# Patient Record
Sex: Male | Born: 1940
Health system: Southern US, Community
[De-identification: ages and names within clinical notes are randomized; demographics above are authoritative.]

## PROBLEM LIST (undated history)

## (undated) DIAGNOSIS — R55 Syncope and collapse: Secondary | ICD-10-CM

## (undated) DIAGNOSIS — R0600 Dyspnea, unspecified: Secondary | ICD-10-CM

## (undated) DIAGNOSIS — R000 Tachycardia, unspecified: Secondary | ICD-10-CM

## (undated) DIAGNOSIS — C439 Malignant melanoma of skin, unspecified: Secondary | ICD-10-CM

## (undated) DIAGNOSIS — E119 Type 2 diabetes mellitus without complications: Secondary | ICD-10-CM

## (undated) DIAGNOSIS — R202 Paresthesia of skin: Secondary | ICD-10-CM

## (undated) DIAGNOSIS — I10 Essential (primary) hypertension: Secondary | ICD-10-CM

## (undated) DIAGNOSIS — R200 Anesthesia of skin: Secondary | ICD-10-CM

## (undated) DIAGNOSIS — R296 Repeated falls: Secondary | ICD-10-CM

## (undated) DIAGNOSIS — R252 Cramp and spasm: Secondary | ICD-10-CM

## (undated) DIAGNOSIS — I499 Cardiac arrhythmia, unspecified: Secondary | ICD-10-CM

## (undated) DIAGNOSIS — M1990 Unspecified osteoarthritis, unspecified site: Secondary | ICD-10-CM

## (undated) DIAGNOSIS — Z86010 Personal history of colonic polyps: Secondary | ICD-10-CM

## (undated) DIAGNOSIS — N200 Calculus of kidney: Secondary | ICD-10-CM

## (undated) DIAGNOSIS — Z8701 Personal history of pneumonia (recurrent): Secondary | ICD-10-CM

## (undated) DIAGNOSIS — K649 Unspecified hemorrhoids: Secondary | ICD-10-CM

## (undated) DIAGNOSIS — G4733 Obstructive sleep apnea (adult) (pediatric): Secondary | ICD-10-CM

## (undated) DIAGNOSIS — K279 Peptic ulcer, site unspecified, unspecified as acute or chronic, without hemorrhage or perforation: Secondary | ICD-10-CM

## (undated) DIAGNOSIS — F329 Major depressive disorder, single episode, unspecified: Secondary | ICD-10-CM

## (undated) DIAGNOSIS — F32A Depression, unspecified: Secondary | ICD-10-CM

## (undated) HISTORY — DX: Depression, unspecified: F32.A

## (undated) HISTORY — DX: Obstructive sleep apnea (adult) (pediatric): G47.33

## (undated) HISTORY — DX: Major depressive disorder, single episode, unspecified: F32.9

## (undated) HISTORY — PX: ESOPHAGOGASTRODUODENOSCOPY: SHX1529

## (undated) HISTORY — PX: CERVICAL FUSION: SHX112

## (undated) HISTORY — DX: Tachycardia, unspecified: R00.0

## (undated) HISTORY — DX: Unspecified hemorrhoids: K64.9

## (undated) HISTORY — DX: Unspecified osteoarthritis, unspecified site: M19.90

## (undated) HISTORY — DX: Syncope and collapse: R55

## (undated) HISTORY — DX: Repeated falls: R29.6

## (undated) HISTORY — DX: Cramp and spasm: R25.2

## (undated) HISTORY — DX: Personal history of colonic polyps: Z86.010

## (undated) HISTORY — PX: ROTATOR CUFF REPAIR: SHX139

## (undated) HISTORY — PX: COLONOSCOPY: SHX174

## (undated) HISTORY — DX: Calculus of kidney: N20.0

## (undated) HISTORY — DX: Personal history of pneumonia (recurrent): Z87.01

## (undated) HISTORY — DX: Malignant melanoma of skin, unspecified: C43.9

## (undated) HISTORY — DX: Paresthesia of skin: R20.2

## (undated) HISTORY — DX: Paresthesia of skin: R20.0

## (undated) HISTORY — DX: Peptic ulcer, site unspecified, unspecified as acute or chronic, without hemorrhage or perforation: K27.9

## (undated) HISTORY — PX: MOHS SURGERY: SUR867

---
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
## 2002-10-31 DIAGNOSIS — Z860100 Personal history of colon polyps, unspecified: Secondary | ICD-10-CM

## 2002-10-31 HISTORY — DX: Personal history of colonic polyps: Z86.010

## 2002-10-31 HISTORY — DX: Personal history of colon polyps, unspecified: Z86.0100

## 2003-02-01 ENCOUNTER — Encounter: Admission: RE | Admit: 2003-02-01 | Discharge: 2003-02-01 | Payer: Self-pay | Admitting: *Deleted

## 2004-01-03 ENCOUNTER — Ambulatory Visit (HOSPITAL_BASED_OUTPATIENT_CLINIC_OR_DEPARTMENT_OTHER): Admission: RE | Admit: 2004-01-03 | Discharge: 2004-01-03 | Payer: Self-pay | Admitting: Internal Medicine

## 2004-04-24 ENCOUNTER — Ambulatory Visit: Payer: Self-pay | Admitting: Internal Medicine

## 2005-11-02 ENCOUNTER — Encounter: Admission: RE | Admit: 2005-11-02 | Discharge: 2005-11-02 | Payer: Self-pay | Admitting: Otolaryngology

## 2006-02-21 ENCOUNTER — Encounter: Admission: RE | Admit: 2006-02-21 | Discharge: 2006-02-21 | Payer: Self-pay | Admitting: Neurology

## 2006-05-17 ENCOUNTER — Ambulatory Visit: Payer: Self-pay | Admitting: Internal Medicine

## 2007-05-24 DIAGNOSIS — J309 Allergic rhinitis, unspecified: Secondary | ICD-10-CM

## 2007-05-24 DIAGNOSIS — G4733 Obstructive sleep apnea (adult) (pediatric): Secondary | ICD-10-CM | POA: Insufficient documentation

## 2007-05-24 HISTORY — DX: Allergic rhinitis, unspecified: J30.9

## 2007-05-25 ENCOUNTER — Ambulatory Visit: Payer: Self-pay | Admitting: Internal Medicine

## 2007-07-11 ENCOUNTER — Encounter: Payer: Self-pay | Admitting: Internal Medicine

## 2007-12-04 ENCOUNTER — Ambulatory Visit: Payer: Self-pay | Admitting: Gastroenterology

## 2007-12-18 ENCOUNTER — Ambulatory Visit: Payer: Self-pay | Admitting: Gastroenterology

## 2008-02-09 ENCOUNTER — Ambulatory Visit: Payer: Self-pay | Admitting: Internal Medicine

## 2008-03-03 ENCOUNTER — Encounter: Payer: Self-pay | Admitting: Internal Medicine

## 2008-07-29 ENCOUNTER — Encounter: Payer: Self-pay | Admitting: Internal Medicine

## 2008-10-15 ENCOUNTER — Encounter: Payer: Self-pay | Admitting: Internal Medicine

## 2008-10-22 ENCOUNTER — Encounter: Payer: Self-pay | Admitting: Internal Medicine

## 2009-02-07 ENCOUNTER — Ambulatory Visit: Payer: Self-pay | Admitting: Internal Medicine

## 2009-05-08 ENCOUNTER — Telehealth (INDEPENDENT_AMBULATORY_CARE_PROVIDER_SITE_OTHER): Payer: Self-pay | Admitting: *Deleted

## 2010-05-05 NOTE — Progress Notes (Signed)
Summary: prescription  Phone Note Call from Patient Call back at Work Phone [phone]   Caller: Patient Call For: young Summary of Call: Pt wants a rx for nasonex.//[facility]-[phone] Initial call taken by: Darletta Moll,  May 08, 2009 9:32 AM  Follow-up for Phone Call        Pt informed that rx reill was sent to [facility]. Abigail Miyamoto RN  May 08, 2009 10:18 AM     Prescriptions: NASONEX 50 MCG/ACT SUSP (MOMETASONE FUROATE) 1-2 sprays each nostril once daily  #1 x prn   Entered by:   Abigail Miyamoto RN   Authorized by:   Waymon Budge MD   Signed by:   Abigail Miyamoto RN on 05/08/2009   Method used:   Electronically to        [facility] [phone]* (retail)       [address]       [place], [place]  [address]       Ph: [phone]       Fax: [phone]   RxID:   [id number]

## 2010-08-21 NOTE — Procedures (Signed)
NAME:  JCION, BUDDENHAGEN                ACCOUNT NO.:  [id number]   MEDICAL RECORD NO.:  [id number]          PATIENT TYPE:  OUT   LOCATION:  SLEEP CENTER                 FACILITY[facility]  [facility]   PHYSICIAN:  Clinton D. Maple Hudson, M.D. DATE OF BIRTH:  1940-07-09   DATE OF STUDY:  01/03/2004  DATE OF DISCHARGE:  01/03/2004                              NOCTURNAL POLYSOMNOGRAM   REFERRING PHYSICIAN:  Dr. Jetty Duhamel   INDICATION FOR STUDY:  Hypersomnia with sleep apnea.  Epworth sleepiness  score 7/24.  BMI 32.  Weight 230 pounds.   SLEEP ARCHITECTURE:  Total sleep time 327 minutes with sleep efficiency 77%.  Stage I was 10%, stage II 81%, stages III and IV were absent.  REM was 9% of  total sleep time.  Sleep latency was 41 minutes.  REM latency was 268  minutes.  Awake after sleep onset 48 minutes.  Arousal index 17.  No sleep  medications were taken.  He had brought Sonata, but chose not to take it.   RESPIRATORY DATA:  RDI 30.8/hour reflecting 56 obstructive apneas and 112  hypopneas.  Events were not positional.  REM RDI 71.  Technician could not  use split protocol because events developed too late in the night to allow  time for titration.   OXYGEN DATA:  Mild to moderate snoring with desaturation to a nadir of 79%  with apneas.  Mean saturation through the study was 92-93% on room air.   CARDIAC DATA:  Sinus bradycardia 48-54 per minute.   MOVEMENT/PARASOMNIA:  Occasional leg jerk, insignificant.   IMPRESSION/RECOMMENDATION:  Moderate obstructive sleep apnea/hypopnea  syndrome, RDI 30.8/hour with desaturation to 79%.  Sinus bradycardia.  Consider return for CPAP titration or evaluation for alternative therapies.      CDY/MEDQ  D:  01/12/2004 10:36:44  T:  01/13/2004 10:46:00  Job:  [id number]

## 2010-08-21 NOTE — Assessment & Plan Note (Signed)
[facility]                             PULMONARY OFFICE NOTE   NAME:[name], Ricardo LITLE                       MRN:          [id number]  DATE:05/17/2006                            DOB:          Feb 06, 1941    PROBLEM:  1. Obstructive sleep apnea.  2. Perennial rhinitis.   HISTORY:  One year followup.  He continues to feel life is better with  CPAP.  He just did an oximetry download on January 31, showing excellent  maintenance of oxygen saturation over 90% through the night on CPAP.  Pressure is set at 9.  He is told that sometimes he appears to stop  breathing and sometimes he is venting through his mouth.  We discussed  options and settled on a decision that we would try setting the pressure  1 step higher to see what he noticed.  He is working now with [place].   MEDICATIONS:  1. Aspirin 81 mg.  2. Wellbutrin 150 mg.  3. CPAP has been at 9 CWP.   No medication allergy.   OBJECTIVE:  Weight 236 pounds.  BP 132/82, pulse regular 72.  Room air  saturation 96%.  He is alert, mildly overweight.  There are no pressure marks around his  face.  Nasal airway is unobstructed, breathing is unlabored.  Pulse regular.   IMPRESSION:  Obstructive sleep apnea probably with good control.  Given  his family observations we have decided to ask [employer] to try moving the  pressure up 1 step to 10 CWP for trial.  He will call if this is  uncomfortable and I will schedule to return in 1 year, earlier as  needed.     Clinton D. Maple Hudson, MD, Tonny Bollman, FACP  Electronically Signed    CDY/MedQ  DD: 05/17/2006  DT: 05/18/2006  Job #: [id number]

## 2011-02-08 ENCOUNTER — Telehealth: Payer: Self-pay | Admitting: Internal Medicine

## 2011-02-08 NOTE — Telephone Encounter (Signed)
Pt last seen 02-2009 so I advised the pt that we are unable to send in refill unless he sets an appt. Pt states he will have to think about it and call us back. Carron Curie, CMA

## 2011-08-17 ENCOUNTER — Encounter: Payer: Self-pay | Admitting: *Deleted

## 2011-09-16 ENCOUNTER — Other Ambulatory Visit: Payer: Self-pay | Admitting: Internal Medicine

## 2011-09-16 DIAGNOSIS — I10 Essential (primary) hypertension: Secondary | ICD-10-CM

## 2011-09-17 ENCOUNTER — Other Ambulatory Visit: Payer: Self-pay

## 2011-09-20 ENCOUNTER — Ambulatory Visit
Admission: RE | Admit: 2011-09-20 | Discharge: 2011-09-20 | Disposition: A | Discharge status: Home / Self Care | Payer: Medicare Other | Source: Ambulatory Visit | Attending: Internal Medicine | Admitting: Internal Medicine

## 2011-09-20 DIAGNOSIS — I10 Essential (primary) hypertension: Secondary | ICD-10-CM

## 2011-12-17 ENCOUNTER — Encounter: Payer: Self-pay | Admitting: Cardiovascular Disease

## 2011-12-21 ENCOUNTER — Encounter: Payer: Self-pay | Admitting: Cardiovascular Disease

## 2012-02-17 ENCOUNTER — Encounter: Payer: Self-pay | Admitting: Gastroenterology

## 2012-03-01 ENCOUNTER — Encounter: Payer: Self-pay | Admitting: *Deleted

## 2012-03-10 ENCOUNTER — Encounter: Payer: Self-pay | Admitting: Gastroenterology

## 2012-03-10 ENCOUNTER — Ambulatory Visit (INDEPENDENT_AMBULATORY_CARE_PROVIDER_SITE_OTHER): Payer: Medicare Other | Admitting: Gastroenterology

## 2012-03-10 VITALS — BP 110/70 | HR 85 | Ht 69.5 in | Wt 218.4 lb

## 2012-03-10 DIAGNOSIS — Z86010 Personal history of colonic polyps: Secondary | ICD-10-CM

## 2012-03-10 DIAGNOSIS — K649 Unspecified hemorrhoids: Secondary | ICD-10-CM

## 2012-03-10 DIAGNOSIS — K625 Hemorrhage of anus and rectum: Secondary | ICD-10-CM

## 2012-03-10 MED ORDER — MOVIPREP 100 G PO SOLR: 1.0000 | Freq: Once | ORAL | Status: DC

## 2012-03-10 MED ORDER — LIDOCAINE-HYDROCORTISONE ACE 3-0.5 % RE CREA: 1.0000 | TOPICAL_CREAM | Freq: Every day | RECTAL | Status: DC

## 2012-03-10 NOTE — Progress Notes (Signed)
History of Present Illness:  This is a 71 year old Caucasian male several weeks of periodic bright red blood per rectum.  He denies other gastrointestinal symptoms.  I did screening colonoscopy on him in 2004 and September 2009.  These exams were unremarkable except for some hyperplastic polyps.  He recently saw his primary care physician and his CBC was normal.  He was placed on Anusol-HC suppositories, but he has not been able to hold these in his rectum.  His family history is noncontributory.  He otherwise is in good health without serious medical problems.  Family history is noncontributory.  He does have a history of benign PVCs, sleep apnea, and chronic depression.  View of his record shows a normal echocardiogram in March of 2011 with normal left ventricular function, and mild aortic stenosis.  Also cares a diagnosis of polycythemia and atypical chest pain.  I have reviewed this patient's present history, medical and surgical past history, allergies and medications.     ROS: The remainder of the 10 point ROS is negative     Physical Exam: Blood pressure 110/70, pulse 85 and regular, and weight 218 pounds the BMI of 31.79.  Oxygen saturation 98%. General well developed well nourished patient in no acute distress, appearing their stated age Eyes PERRLA, no icterus, fundoscopic exam per opthamologist Skin no lesions noted Neck supple, no adenopathy, no thyroid enlargement, no tenderness Chest clear to percussion and auscultation Heart no significant murmurs, gallops or rubs noted Abdomen no hepatosplenomegaly masses or tenderness, BS normal.  Rectal inspection normal no fissures, or fistulae noted.  No masses or tenderness on digital exam. Stool guaiac negative.  There is a large posterior lateral external hemorrhoid.  There was a clot in his hemorrhoid  which was manually extruded today. Extremities no acute joint lesions, edema, phlebitis or evidence of cellulitis. Neurologic patient  oriented x 3, cranial nerves intact, no focal neurologic deficits noted. Psychological mental status normal and normal affect.  Assessment and plan: Hematochezia from external hemorrhoids in a 71 year old patient with a history of colon polyps.  I have set him up for colonoscopy exam at his convenience.  His bleeding obviously is from his hemorrhoids, and I have asked him to do each bedtime Sitz baths with local Analpram cream as tolerated.  Also recommend a high fiber diet with daily Metamucil and liberal by mouth fluids.  His continue his other medications as per primary care.  Encounter Diagnoses  Name Primary?  . Hemorrhoids Yes  . Hx of colonic polyps

## 2012-03-10 NOTE — Patient Instructions (Signed)
You have been scheduled for a colonoscopy with propofol. Please follow written instructions given to you at your visit today.  Please pick up your prep kit at the pharmacy within the next 1-3 days. If you use inhalers (even only as needed) or a CPAP machine, please bring them with you on the day of your procedure.  We have sent the following medications to your pharmacy for you to pick up at your convenience: Anamantle cream. Please apply at bedtime.  Please purchase Metamucil over the counter. Take as directed.  Please follow high fiber diet given today.  Information on hemorrhoids and sitz bath given today.  CC: Jarome Matin, MD   _______________________________________________________________________________________________________________  High-Fiber Diet Fiber is found in fruits, vegetables, and grains. A high-fiber diet encourages the addition of more whole grains, legumes, fruits, and vegetables in your diet. The recommended amount of fiber for adult males is 38 g per day. For adult females, it is 25 g per day. Pregnant and lactating women should get 28 g of fiber per day. If you have a digestive or bowel problem, ask your caregiver for advice before adding high-fiber foods to your diet. Eat a variety of high-fiber foods instead of only a select few type of foods.  PURPOSE  To increase stool bulk.  To make bowel movements more regular to prevent constipation.  To lower cholesterol.  To prevent overeating. WHEN IS THIS DIET USED?  It may be used if you have constipation and hemorrhoids.  It may be used if you have uncomplicated diverticulosis (intestine condition) and irritable bowel syndrome.  It may be used if you need help with weight management.  It may be used if you want to add it to your diet as a protective measure against atherosclerosis, diabetes, and cancer. SOURCES OF FIBER  Whole-grain breads and cereals.  Fruits, such as apples, oranges, bananas,  berries, prunes, and pears.  Vegetables, such as green peas, carrots, sweet potatoes, beets, broccoli, cabbage, spinach, and artichokes.  Legumes, such split peas, soy, lentils.  Almonds. FIBER CONTENT IN FOODS Starches and Grains / Dietary Fiber (g)  Cheerios, 1 cup / 3 g  Corn Flakes cereal, 1 cup / 0.7 g  Rice crispy treat cereal, 1 cup / 0.3 g  Instant oatmeal (cooked),  cup / 2 g  Frosted wheat cereal, 1 cup / 5.1 g  Brown, long-grain rice (cooked), 1 cup / 3.5 g  White, long-grain rice (cooked), 1 cup / 0.6 g  Enriched macaroni (cooked), 1 cup / 2.5 g Legumes / Dietary Fiber (g)  Baked beans (canned, plain, or vegetarian),  cup / 5.2 g  Kidney beans (canned),  cup / 6.8 g  Pinto beans (cooked),  cup / 5.5 g Breads and Crackers / Dietary Fiber (g)  Plain or honey graham crackers, 2 squares / 0.7 g  Saltine crackers, 3 squares / 0.3 g  Plain, salted pretzels, 10 pieces / 1.8 g  Whole-wheat bread, 1 slice / 1.9 g  White bread, 1 slice / 0.7 g  Raisin bread, 1 slice / 1.2 g  Plain bagel, 3 oz / 2 g  Flour tortilla, 1 oz / 0.9 g  Corn tortilla, 1 small / 1.5 g  Hamburger or hotdog bun, 1 small / 0.9 g Fruits / Dietary Fiber (g)  Apple with skin, 1 medium / 4.4 g  Sweetened applesauce,  cup / 1.5 g  Banana,  medium / 1.5 g  Grapes, 10 grapes / 0.4 g  Orange,  1 small / 2.3 g  Raisin, 1.5 oz / 1.6 g  Melon, 1 cup / 1.4 g Vegetables / Dietary Fiber (g)  Green beans (canned),  cup / 1.3 g  Carrots (cooked),  cup / 2.3 g  Broccoli (cooked),  cup / 2.8 g  Peas (cooked),  cup / 4.4 g  Mashed potatoes,  cup / 1.6 g  Lettuce, 1 cup / 0.5 g  Corn (canned),  cup / 1.6 g  Tomato,  cup / 1.1 g Document Released: 03/22/2005 Document Revised: 09/21/2011 Document Reviewed: 06/24/2011 Exit[employer] Patient Information 2013 [employer].  _________________________________________________________________________________________________________________  Hemorrhoids Hemorrhoids are enlarged (dilated) veins around the rectum. There are 2 types of hemorrhoids, and the type of hemorrhoid is determined by its location. Internal hemorrhoids occur in the veins just inside the rectum.They are usually not painful, but they may bleed.However, they may poke through to the outside and become irritated and painful. External hemorrhoids involve the veins outside the anus and can be felt as a painful swelling or hard lump near the anus.They are often itchy and may crack and bleed. Sometimes clots will form in the veins. This makes them swollen and painful. These are called thrombosed hemorrhoids. CAUSES Causes of hemorrhoids include:  Pregnancy. This increases the pressure in the hemorrhoidal veins.  Constipation.  Straining to have a bowel movement.  Obesity.  Heavy lifting or other activity that caused you to strain. TREATMENT Most of the time hemorrhoids improve in 1 to 2 weeks. However, if symptoms do not seem to be getting better or if you have a lot of rectal bleeding, your caregiver may perform a procedure to help make the hemorrhoids get smaller or remove them completely.Possible treatments include:  Rubber band ligation. A rubber band is placed at the base of the hemorrhoid to cut off the circulation.  Sclerotherapy. A chemical is injected to shrink the hemorrhoid.  Infrared light therapy. Tools are used to burn the hemorrhoid.  Hemorrhoidectomy. This is surgical removal of the hemorrhoid. HOME CARE INSTRUCTIONS   Increase fiber in your diet. Ask your caregiver about using fiber supplements.  Drink enough water and fluids to keep your urine clear or pale yellow.  Exercise regularly.  Go to the bathroom when you have the urge to have a bowel movement. Do not wait.  Avoid straining to have bowel  movements.  Keep the anal area dry and clean.  Only take over-the-counter or prescription medicines for pain, discomfort, or fever as directed by your caregiver. If your hemorrhoids are thrombosed:  Take warm sitz baths for 20 to 30 minutes, 3 to 4 times per day.  If the hemorrhoids are very tender and swollen, place ice packs on the area as tolerated. Using ice packs between sitz baths may be helpful. Fill a plastic bag with ice. Place a towel between the bag of ice and your skin.  Medicated creams and suppositories may be used or applied as directed.  Do not use a donut-shaped pillow or sit on the toilet for long periods. This increases blood pooling and pain. SEEK MEDICAL CARE IF:   You have increasing pain and swelling that is not controlled with your medicine.  You have uncontrolled bleeding.  You have difficulty or you are unable to have a bowel movement.  You have pain or inflammation outside the area of the hemorrhoids.  You have chills or an oral temperature above 102 F (38.9 C). MAKE SURE YOU:   Understand these instructions.  Will  watch your condition.  Will get help right away if you are not doing well or get worse. Document Released: 03/19/2000 Document Revised: 06/14/2011 Document Reviewed: 03/02/2010 [facility] Patient Information 2013 [place], [place].

## 2012-03-20 ENCOUNTER — Ambulatory Visit (AMBULATORY_SURGERY_CENTER): Payer: Medicare Other | Admitting: Gastroenterology

## 2012-03-20 ENCOUNTER — Encounter: Payer: Self-pay | Admitting: Gastroenterology

## 2012-03-20 VITALS — BP 142/97 | HR 66 | Temp 97.1°F | Resp 17 | Ht 69.0 in | Wt 218.0 lb

## 2012-03-20 DIAGNOSIS — D126 Benign neoplasm of colon, unspecified: Secondary | ICD-10-CM

## 2012-03-20 DIAGNOSIS — Z86010 Personal history of colonic polyps: Secondary | ICD-10-CM

## 2012-03-20 DIAGNOSIS — K649 Unspecified hemorrhoids: Secondary | ICD-10-CM

## 2012-03-20 MED ORDER — SODIUM CHLORIDE 0.9 % IV SOLN: 500.0000 mL | INTRAVENOUS | Status: DC

## 2012-03-20 NOTE — Progress Notes (Signed)
Called to room to assist during endoscopic procedure.  Patient ID and intended procedure confirmed with present staff. Received instructions for my participation in the procedure from the performing physician. ewm 

## 2012-03-20 NOTE — Progress Notes (Signed)
Pt. Felt that he needed his device for sleep apnea-not a c-pap machine.  Spoke with [facility] CRNA about pt. Concerns. She stated that the device would not be necessary-pt. Advised.

## 2012-03-20 NOTE — Progress Notes (Addendum)
Patient did not have preoperative order for IV antibiotic SSI prophylaxis. (G8918)  Patient did not experience any of the following events: a burn prior to discharge; a fall within the facility; wrong site/side/patient/procedure/implant event; or a hospital transfer or hospital admission upon discharge from the facility. (G8907)  

## 2012-03-20 NOTE — Patient Instructions (Addendum)

## 2012-03-20 NOTE — Op Note (Signed)
[facility] [address] [place], [address]   COLONOSCOPY PROCEDURE REPORT  PATIENT: Ricardo [name], Ricardo [name]  MR#: [id number] BIRTHDATE: 27-Apr-1940 , 71  yrs. old GENDER: Male ENDOSCOPIST: Mardella Layman, MD, [facility] REFERRED BY:  Jarome Matin, M.D. PROCEDURE DATE:  03/20/2012 PROCEDURE:   Colonoscopy with snare polypectomy ASA CLASS:   Class II INDICATIONS:Rectal Bleeding and Average risk patient for colon cancer. MEDICATIONS: propofol (Diprivan) 300mg  IV  DESCRIPTION OF PROCEDURE:   After the risks and benefits and of the procedure were explained, informed consent was obtained.  A digital rectal exam revealed no abnormalities of the rectum.    The LB [id number]  endoscope was introduced through the anus and advanced to the cecum, which was identified by both the appendix and ileocecal valve .  The quality of the prep was excellent, using MoviPrep .  The instrument was then slowly withdrawn as the colon was fully examined.     COLON FINDINGS: Moderate diverticulosis was noted in the descending colon and sigmoid colon.   Two smooth flat polyps ranging between 3-34mm in size were found in the rectum.  A polypectomy was performed using snare cautery.  The resection was complete and the polyp tissue was completely retrieved.     Retroflexed views revealed no abnormalities.     The scope was then withdrawn from the patient and the procedure completed.  COMPLICATIONS: There were no complications. ENDOSCOPIC IMPRESSION: 1.   Moderate diverticulosis was noted in the descending colon and sigmoid colon 2.   Two flat polyps ranging between 3-2mm in size were found in the rectum; polypectomy was performed using snare cautery ...these appear to be in a very vascular polyps, and appear to be the site of the rectal bleeding.  They were removed by electrocautery techniques and cauterized.  RECOMMENDATIONS: 1.  Avoid all NSAIDS for the next 2 weeks. 2.  Await  pathology results 3.  Repeat colonoscopy in 5 years if polyp adenomatous; otherwise 10 years 4.  High fiber diet   REPEAT EXAM:  cc:  _______________________________ eSignedMardella Layman, MD, [facility] 03/20/2012 2:07 PM     PATIENT NAME:  Ricardo [name], Ricardo [name] MR#: [id number]

## 2012-03-21 ENCOUNTER — Telehealth: Payer: Self-pay | Admitting: *Deleted

## 2012-03-21 NOTE — Telephone Encounter (Signed)
  Follow up Call-  Call back number 03/20/2012  Post procedure Call Back phone  # [phone]  close to 8:30  Permission to leave phone message Yes     Patient questions:  Do you have a fever, pain , or abdominal swelling? no Pain Score  0 *  Have you tolerated food without any problems? yes  Have you been able to return to your normal activities? yes  Do you have any questions about your discharge instructions: Diet   no Medications  no Follow up visit  no  Do you have questions or concerns about your Care? no  Actions: * If pain score is 4 or above: No action needed, pain <4.

## 2012-04-04 ENCOUNTER — Encounter: Payer: Self-pay | Admitting: Gastroenterology

## 2012-10-16 ENCOUNTER — Other Ambulatory Visit: Payer: Self-pay | Admitting: Dermatology

## 2015-03-05 ENCOUNTER — Ambulatory Visit (INDEPENDENT_AMBULATORY_CARE_PROVIDER_SITE_OTHER): Payer: PPO | Admitting: Emergency Medicine

## 2015-03-05 ENCOUNTER — Encounter: Payer: Self-pay | Admitting: Emergency Medicine

## 2015-03-05 VITALS — BP 154/94 | HR 76 | Ht 70.0 in | Wt 221.0 lb

## 2015-03-05 DIAGNOSIS — G4733 Obstructive sleep apnea (adult) (pediatric): Secondary | ICD-10-CM | POA: Diagnosis not present

## 2015-03-05 NOTE — Progress Notes (Signed)
   Subjective:    Patient ID: Ricardo [name], male    DOB: [birth year], 74 y.o.   MRN: [id number]  HPI 74 year old man, former smoker, with a history of known obstructive sleep apnea based on a remote polysomnogram back in .   He was on CPAP 2005-2010. He stopped it after he tried dental appliance - still uses it. It does help his OSA but it has altered his bite, causing him trouble. He is interested in in getting back on CPAP. He denies napping, falling asleep unintentionally. He is well rested as long as he uses his oral appliance   Review of Systems  Past Medical History  Diagnosis Date  . Syncope   . Tachycardia   . Chest pain   . Hx of colonic polyps 10/31/2002    Hyperplastic   . Hemorrhoids   . Depression   . Obstructive sleep apnea   . Peptic ulcer   . History of pneumonia      Family History  Problem Relation Age of Onset  . Stroke Father   . Hypertension Mother   . Pancreatic cancer Mother   . Anuerysm Brother     AAA  . Anuerysm Father     AAA     Social History   Social History  . Marital Status: Married    Spouse Name: N/A  . Number of Children: 3  . 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 Smoking status: Former Smoker    Types: Pipe    Quit date: 04/05/1988  . Smokeless tobacco: Never Used  . Alcohol Use: Yes     Comment: occasionally  . Drug Use: No  . Sexual Activity: Not on file   Other Topics Concern  . Not on file   Social History Narrative     No Known Allergies   Outpatient Prescriptions Prior to Visit  Medication Sig Dispense Refill  . Multiple Vitamin (MULTIVITAMIN) tablet Take 1 tablet by mouth daily.    Marland Kitchen aspirin 81 MG tablet Take 162 mg by mouth daily.    Marland Kitchen buPROPion (WELLBUTRIN XL) 150 MG 24 hr tablet Take 300 mg by mouth daily.    Marland Kitchen lidocaine-hydrocortisone (ANAMANTLE HC) 3-0.5 % CREA Place 1 Applicatorful rectally at bedtime. 7 g 2   No  facility-administered medications prior to visit.         Objective:   Physical Exam Filed Vitals:   03/05/15 1547  BP: 154/94  Pulse: 76  Height: 5\' 10"  (1.778 m)  Weight: 221 lb (100.245 kg)  SpO2: 96%   Gen: Pleasant, well-nourished, in no distress,  normal affect  ENT: No lesions,  mouth clear,  oropharynx clear, no postnasal drip  Neck: No JVD, no TMG, no carotid bruits  Lungs: No use of accessory muscles, no dullness to percussion, clear without rales or rhonchi  Cardiovascular: RRR, heart sounds normal, no murmur or gallops, no peripheral edema  Abdomen: soft and NT, no HSM,  BS normal  Musculoskeletal: No deformities, no cyanosis or clubbing  Neuro: alert, non focal  Skin: Warm, no lesions or rashes       Assessment & Plan:  SLEEP APNEA We will arrange for a home sleep study. Once we confirm that he has obstructive sleep apnea we will arrange for a home CPAP titration study and then order his CPAP. I will follow-up with him in 2 months

## 2015-03-05 NOTE — Patient Instructions (Signed)
We will order a home sleep study.  Once we confirm that you have sleep apnea we will arrange for a home CPAP titration study.   Continue to use your oral appliance for now  Follow with Dr Lamonte Sakai in 2 months or sooner if you have any problems.

## 2015-03-05 NOTE — Assessment & Plan Note (Signed)
We will arrange for a home sleep study. Once we confirm that he has obstructive sleep apnea we will arrange for a home CPAP titration study and then order his CPAP. I will follow-up with him in 2 months

## 2015-03-19 DIAGNOSIS — G4733 Obstructive sleep apnea (adult) (pediatric): Secondary | ICD-10-CM | POA: Diagnosis not present

## 2015-03-21 DIAGNOSIS — G4733 Obstructive sleep apnea (adult) (pediatric): Secondary | ICD-10-CM | POA: Diagnosis not present

## 2015-03-26 ENCOUNTER — Encounter: Payer: Self-pay | Admitting: Emergency Medicine

## 2015-04-03 ENCOUNTER — Telehealth: Payer: Self-pay | Admitting: Emergency Medicine

## 2015-04-03 DIAGNOSIS — G4733 Obstructive sleep apnea (adult) (pediatric): Secondary | ICD-10-CM

## 2015-04-03 NOTE — Telephone Encounter (Signed)
Pt returning call [phone]

## 2015-04-03 NOTE — Telephone Encounter (Signed)
atc X2, fast busy signal.  Wcb.  

## 2015-04-03 NOTE — Telephone Encounter (Signed)
LVM for patient to return call. 

## 2015-04-04 NOTE — Telephone Encounter (Signed)
Pt called and is requesting his Ricardo [name] sleep results from 12/14 (in epic). Please advise RB on results. thanks

## 2015-04-07 NOTE — Telephone Encounter (Signed)
Please let the patient know that his Home sleep study is consistent with moderate sleep apnea with contributions of both obstructive apnea and central apneas. If he is willing to do so then we should attempt to set up CPAP.  I believe this would best be accomplished with a CPAP titration study in sleep lab. Please order if he is willing

## 2015-04-08 NOTE — Telephone Encounter (Signed)
Spoke with pt. He is aware of his sleep study results. Order has been placed for CPAP titration study. Nothing further was needed. 

## 2015-04-08 NOTE — Telephone Encounter (Signed)
[phone] calling back

## 2015-04-08 NOTE — Telephone Encounter (Signed)
Attempted to call pt. No answer. Will try back. 

## 2015-04-21 ENCOUNTER — Ambulatory Visit (HOSPITAL_BASED_OUTPATIENT_CLINIC_OR_DEPARTMENT_OTHER): Payer: PPO | Attending: Emergency Medicine | Admitting: Radiology

## 2015-04-21 DIAGNOSIS — G4733 Obstructive sleep apnea (adult) (pediatric): Secondary | ICD-10-CM | POA: Diagnosis not present

## 2015-04-21 DIAGNOSIS — G4730 Sleep apnea, unspecified: Secondary | ICD-10-CM | POA: Diagnosis present

## 2015-04-21 DIAGNOSIS — I493 Ventricular premature depolarization: Secondary | ICD-10-CM | POA: Diagnosis not present

## 2015-04-21 DIAGNOSIS — G4761 Periodic limb movement disorder: Secondary | ICD-10-CM | POA: Insufficient documentation

## 2015-04-23 DIAGNOSIS — G4733 Obstructive sleep apnea (adult) (pediatric): Secondary | ICD-10-CM | POA: Diagnosis not present

## 2015-04-23 NOTE — Progress Notes (Signed)
Patient Name: Ricardo [name], Ricardo [name] Date: 04/21/2015 Gender: Male D.O.B: 1940-11-20 Age (years): 74 Referring Provider: Baltazar Apo Height (inches): 70 Interpreting Physician: Chesley Mires MD, ABSM Weight (lbs): 222 RPSGT: Carolin Coy BMI: 32 MRN: [id number] Neck Size: 16.00  CLINICAL INFORMATION The patient is referred for a CPAP titration to treat sleep apnea.   Date of NPSG, Split Night or HST: 03/19/15  SLEEP STUDY TECHNIQUE As per the AASM Manual for the Scoring of Sleep and Associated Events v2.3 (April 2016) with a hypopnea requiring 4% desaturations. The channels recorded and monitored were frontal, central and occipital EEG, electrooculogram (EOG), submentalis EMG (chin), nasal and oral airflow, thoracic and abdominal wall motion, anterior tibialis EMG, snore microphone, electrocardiogram, and pulse oximetry. Continuous positive airway pressure (CPAP) was initiated at the beginning of the study and titrated to treat sleep-disordered breathing.  MEDICATIONS Medications taken by the patient : reviewed in electronic medical record. Medications administered by patient during sleep study : No sleep medicine administered.  TECHNICIAN COMMENTS Comments added by technician: Patient was ordered as a cpap titration.  Comments added by scorer: N/A  RESPIRATORY PARAMETERS Optimal PAP Pressure (cm): 9 AHI at Optimal Pressure (/hr): 0.6 Overall Minimal O2 (%): 91.00 Supine % at Optimal Pressure (%): 100 Minimal O2 at Optimal Pressure (%): 91.0    SLEEP ARCHITECTURE The study was initiated at 11:02:04 PM and ended at 5:19:21 AM. Sleep onset time was 14.5 minutes and the sleep efficiency was 78.4%. The total sleep time was 295.8 minutes. The patient spent 7.77% of the night in stage N1 sleep, 82.76% in stage N2 sleep, 0.34% in stage N3 and 9.13% in REM.Stage REM latency was 224.0 minutes Wake after sleep onset was 67.0. Alpha intrusion was absent. Supine sleep was 96.45%.  CARDIAC  DATA The 2 lead EKG demonstrated sinus rhythm. The mean heart rate was 56.93 beats per minute. Other EKG findings include: PVCs.  LEG MOVEMENT DATA The total Periodic Limb Movements of Sleep (PLMS) were 313. The PLMS index was 63.48. A PLMS index of <15 is considered normal in adults.  IMPRESSIONS This was a successful CPAP titration.  He did well with CPAP 9 cm H2O.  He was observed in REM sleep at this pressure setting.  He had an increase in his periodic limb movement index.  DIAGNOSIS - Obstructive Sleep Apnea (327.23 [G47.33 ICD-10])  RECOMMENDATIONS Trial of CPAP 9 cm H2O.  He was fitted with a Standard size Resmed Nasal Mask Mirage FX mask.  Assess for the presence of restless leg syndrome.  Chesley Mires, MD, [place], American Board of Sleep Medicine 04/23/2015, 4:50 PM  NPI: [id number]

## 2015-05-09 ENCOUNTER — Encounter: Payer: Self-pay | Admitting: Emergency Medicine

## 2015-05-09 ENCOUNTER — Ambulatory Visit (INDEPENDENT_AMBULATORY_CARE_PROVIDER_SITE_OTHER): Payer: PPO | Admitting: Emergency Medicine

## 2015-05-09 VITALS — BP 154/90 | HR 76 | Ht 70.0 in | Wt 221.0 lb

## 2015-05-09 DIAGNOSIS — G4733 Obstructive sleep apnea (adult) (pediatric): Secondary | ICD-10-CM | POA: Diagnosis not present

## 2015-05-09 NOTE — Addendum Note (Signed)
Addended by: Virl Cagey on: 05/09/2015 03:37 PM   Modules accepted: Orders

## 2015-05-09 NOTE — Assessment & Plan Note (Signed)
The patient's home sleep study in split-night sleep study confirmed sleep apnea and confirm that he has resolution with CPAP at 9 cmH2O. He use a nasal mask with chinstrap we will order this for him for [facility]

## 2015-05-09 NOTE — Progress Notes (Signed)
Subjective:    Patient ID: Ricardo [name], male    DOB: December 17, 1940, 75 y.o.   MRN: [id number]  HPI 74 year old man, former smoker, with a history of known obstructive sleep apnea based on a remote polysomnogram back in .   He was on CPAP 2005-2010. He stopped it after he tried dental appliance - still uses it. It does help his OSA but it has altered his bite, causing him trouble. He is interested in in getting back on CPAP. He denies napping, falling asleep unintentionally. He is well rested as long as he uses his oral appliance  ROV 05/09/15 -- patient follows up for his obstructive sleep apnea. He underwent a home sleep study /16/17 that confirmed obstructive sleep apnea with titration to 9 cm of water.  Nasal mask with a chin strap.    Review of Systems  Past Medical History  Diagnosis Date  . Syncope   . Tachycardia   . Chest pain   . Hx of colonic polyps 10/31/2002    Hyperplastic   . Hemorrhoids   . Depression   . Obstructive sleep apnea   . Peptic ulcer   . History of pneumonia      Family History  Problem Relation Age of Onset  . Stroke Father   . Hypertension Mother   . Pancreatic cancer Mother   . Anuerysm Brother     AAA  . Anuerysm Father     AAA     Social History   Social History  . Marital Status: Married    Spouse Name: N/A  . Number of Children: 3  . 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 Smoking status: Former Smoker    Types: Pipe    Quit date: 04/05/1988  . Smokeless tobacco: Never Used  . Alcohol Use: Yes     Comment: occasionally  . Drug Use: No  . Sexual Activity: Not on file   Other Topics Concern  . Not on file   Social History Narrative     No Known Allergies   Outpatient Prescriptions Prior to Visit  Medication Sig Dispense Refill  . aspirin 81 MG tablet Take 81 mg by mouth daily.    . Multiple Vitamin (MULTIVITAMIN) tablet Take 1 tablet by mouth daily.      No facility-administered medications prior to visit.         Objective:   Physical Exam Filed Vitals:   05/09/15 1457 05/09/15 1459  BP: 162/86 154/90  Pulse: 76   Height: 5\' 10"  (1.778 m)   Weight: 221 lb (100.245 kg)   SpO2: 94%    Gen: Pleasant, well-nourished, in no distress,  normal affect  ENT: No lesions,  mouth clear,  oropharynx clear, no postnasal drip  Neck: No JVD, no TMG, no carotid bruits  Lungs: No use of accessory muscles, no dullness to percussion, clear without rales or rhonchi  Cardiovascular: RRR, heart sounds normal, no murmur or gallops, no peripheral edema  Abdomen: soft and NT, no HSM,  BS normal  Musculoskeletal: No deformities, no cyanosis or clubbing  Neuro: alert, non focal  Skin: Warm, no lesions or rashes       Assessment & Plan:  Obstructive sleep apnea The patient's home sleep study in split-night sleep study confirmed sleep apnea and confirm that he has resolution with CPAP at 9 cmH2O. He use a nasal mask with chinstrap we will  order this for him for [facility]

## 2015-05-09 NOTE — Patient Instructions (Signed)
We will plan to start CPAP 9cm H2O with a nasal mask  Follow with Dr Lamonte Sakai in 2 months with a device download

## 2015-06-01 ENCOUNTER — Encounter (HOSPITAL_BASED_OUTPATIENT_CLINIC_OR_DEPARTMENT_OTHER): Payer: PPO

## 2015-06-09 ENCOUNTER — Institutional Professional Consult (permissible substitution): Payer: Medicare Other | Admitting: Internal Medicine

## 2015-07-07 ENCOUNTER — Encounter: Payer: Self-pay | Admitting: Emergency Medicine

## 2015-07-07 ENCOUNTER — Ambulatory Visit (INDEPENDENT_AMBULATORY_CARE_PROVIDER_SITE_OTHER): Payer: PPO | Admitting: Emergency Medicine

## 2015-07-07 VITALS — BP 122/62 | HR 80 | Wt 223.0 lb

## 2015-07-07 DIAGNOSIS — G4733 Obstructive sleep apnea (adult) (pediatric): Secondary | ICD-10-CM | POA: Diagnosis not present

## 2015-07-07 NOTE — Progress Notes (Signed)
Subjective:    Patient ID: Ricardo [name], male    DOB: 12/20/1940, 75 y.o.   MRN: [id number]  HPI 75 year old man, former smoker, with a history of known obstructive sleep apnea based on a remote polysomnogram back in .   He was on CPAP 2005-2010. He stopped it after he tried dental appliance - still uses it. It does help his OSA but it has altered his bite, causing him trouble. He is interested in in getting back on CPAP. He denies napping, falling asleep unintentionally. He is well rested as long as he uses his oral appliance  ROV 05/09/15 -- patient follows up for his obstructive sleep apnea. He underwent a home sleep study /16/17 that confirmed obstructive sleep apnea with titration to 9 cm of water.  Nasal mask with a chin strap.   ROV 07/07/15 -- follow-up visit for obstructive sleep apnea. He is now treated with CPAP at 9cm of water.  He is using reliably, wears it every night > confirmed on download. He believes it is helping him. Seldom gets up at night. He has no daytime sleepiness. Does not nap, does not fall asleep accidentally.    Review of Systems  Past Medical History  Diagnosis Date  . Syncope   . Tachycardia   . Chest pain   . Hx of colonic polyps 10/31/2002    Hyperplastic   . Hemorrhoids   . Depression   . Obstructive sleep apnea   . Peptic ulcer   . History of pneumonia      Family History  Problem Relation Age of Onset  . Stroke Father   . Hypertension Mother   . Pancreatic cancer Mother   . Anuerysm Brother     AAA  . Anuerysm Father     AAA     Social History   Social History  . Marital Status: Married    Spouse Name: N/A  . Number of Children: 3  . 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 Smoking status: Former Smoker    Types: Pipe    Quit date: 04/05/1988  . Smokeless tobacco: Never Used  . Alcohol Use: Yes     Comment: occasionally  . Drug Use: No  . Sexual Activity:  Not on file   Other Topics Concern  . Not on file   Social History Narrative     No Known Allergies   Outpatient Prescriptions Prior to Visit  Medication Sig Dispense Refill  . Multiple Vitamin (MULTIVITAMIN) tablet Take 1 tablet by mouth daily.    Marland Kitchen aspirin 81 MG tablet Take 81 mg by mouth daily. Reported on 07/07/2015     No facility-administered medications prior to visit.         Objective:   Physical Exam Filed Vitals:   07/07/15 1451  BP: 122/62  Pulse: 80  Weight: 223 lb (101.152 kg)  SpO2: 95%   Gen: Pleasant, well-nourished, in no distress,  normal affect  ENT: No lesions,  mouth clear,  oropharynx clear, no postnasal drip  Neck: No JVD, no TMG, no carotid bruits  Lungs: No use of accessory muscles, no dullness to percussion, clear without rales or rhonchi  Cardiovascular: RRR, heart sounds normal, no murmur or gallops, no peripheral edema  Musculoskeletal: No deformities, no cyanosis or clubbing  Neuro: alert, non focal  Skin: Warm, no lesions or rashes       Assessment &  Plan:  Obstructive sleep apnea Very good compliance with his CPAP device as documented by his download today. He used the CPAP 100% of the time and greater than 4 hours 100% of the time. Documented benefit. Continue the same and follow-up in one year or sooner if needed

## 2015-07-07 NOTE — Assessment & Plan Note (Signed)
Very good compliance with his CPAP device as documented by his download today. He used the CPAP 100% of the time and greater than 4 hours 100% of the time. Documented benefit. Continue the same and follow-up in one year or sooner if needed

## 2015-07-07 NOTE — Patient Instructions (Signed)
You have great compliance with your CPAP device. You have used it 100 percent of the time for greater than 4 hours a night We have documented that you are benefiting from the CPAP both lung sleeping and also during the day. Follow with Dr Lamonte Sakai in 12 months or sooner if you have any problems

## 2015-07-25 ENCOUNTER — Encounter: Payer: Self-pay | Admitting: Gastroenterology

## 2015-08-28 ENCOUNTER — Other Ambulatory Visit: Payer: Self-pay

## 2015-08-28 ENCOUNTER — Other Ambulatory Visit: Payer: Self-pay | Admitting: Orthopedic Surgery

## 2015-08-28 DIAGNOSIS — M5136 Other intervertebral disc degeneration, lumbar region: Secondary | ICD-10-CM

## 2015-09-17 ENCOUNTER — Ambulatory Visit
Admission: RE | Admit: 2015-09-17 | Discharge: 2015-09-17 | Disposition: A | Discharge status: Home / Self Care | Payer: PPO | Source: Ambulatory Visit | Attending: Orthopedic Surgery | Admitting: Orthopedic Surgery

## 2015-09-17 ENCOUNTER — Ambulatory Visit
Admission: RE | Admit: 2015-09-17 | Discharge: 2015-09-17 | Disposition: A | Discharge status: Home / Self Care | Payer: Self-pay | Source: Ambulatory Visit | Attending: Orthopedic Surgery | Admitting: Orthopedic Surgery

## 2015-09-17 ENCOUNTER — Other Ambulatory Visit: Payer: Self-pay | Admitting: Orthopedic Surgery

## 2015-09-17 DIAGNOSIS — M5136 Other intervertebral disc degeneration, lumbar region: Secondary | ICD-10-CM

## 2015-09-17 DIAGNOSIS — R52 Pain, unspecified: Secondary | ICD-10-CM

## 2015-09-17 MED ORDER — IOPAMIDOL (ISOVUE-M 200) INJECTION 41%: 15.0000 mL | Freq: Once | INTRAMUSCULAR | Status: DC

## 2015-09-17 MED ORDER — DIAZEPAM 5 MG PO TABS
5.0000 mg | ORAL_TABLET | Freq: Once | ORAL | Status: AC
  Administered 2015-09-17: 5 mg via ORAL

## 2015-09-17 NOTE — Discharge Instructions (Signed)
Myelogram Discharge Instructions  1. Go home and rest quietly for the next 24 hours.  It is important to lie flat for the next 24 hours.  Get up only to go to the restroom.  You may lie in the bed or on a couch on your back, your stomach, your left side or your right side.  You may have one pillow under your head.  You may have pillows between your knees while you are on your side or under your knees while you are on your back.  2. DO NOT drive today.  Recline the seat as far back as it will go, while still wearing your seat belt, on the way home.  3. You may get up to go to the bathroom as needed.  You may sit up for 10 minutes to eat.  You may resume your normal diet and medications unless otherwise indicated.  Drink lots of extra fluids today and tomorrow.  4. The incidence of headache, nausea, or vomiting is about 5% (one in 20 patients).  If you develop a headache, lie flat and drink plenty of fluids until the headache goes away.  Caffeinated beverages may be helpful.  If you develop severe nausea and vomiting or a headache that does not go away with flat bed rest, call [phone].  5. You may resume normal activities after your 24 hours of bed rest is over; however, do not exert yourself strongly or do any heavy lifting tomorrow. If when you get up you have a headache when standing, go back to bed and force fluids for another 24 hours.  6. Call your physician for a follow-up appointment.  The results of your myelogram will be sent directly to your physician by the following day.  7. If you have any questions or if complications develop after you arrive home, please call [phone].  Discharge instructions have been explained to the patient.  The patient, or the person responsible for the patient, fully understands these instructions.         May resume Lexapro on September 18, 2015, after 1:00 pm.

## 2015-09-17 NOTE — Progress Notes (Signed)
Pt states he has been off Lexapro since Sunday.

## 2016-04-14 DIAGNOSIS — G4733 Obstructive sleep apnea (adult) (pediatric): Secondary | ICD-10-CM | POA: Diagnosis not present

## 2016-06-08 DIAGNOSIS — G4733 Obstructive sleep apnea (adult) (pediatric): Secondary | ICD-10-CM | POA: Diagnosis not present

## 2016-07-13 ENCOUNTER — Encounter: Payer: Self-pay | Admitting: Emergency Medicine

## 2016-07-13 ENCOUNTER — Ambulatory Visit (INDEPENDENT_AMBULATORY_CARE_PROVIDER_SITE_OTHER): Payer: Medicare HMO | Admitting: Emergency Medicine

## 2016-07-13 DIAGNOSIS — M545 Low back pain: Secondary | ICD-10-CM | POA: Diagnosis not present

## 2016-07-13 DIAGNOSIS — Z1389 Encounter for screening for other disorder: Secondary | ICD-10-CM | POA: Diagnosis not present

## 2016-07-13 DIAGNOSIS — I10 Essential (primary) hypertension: Secondary | ICD-10-CM | POA: Diagnosis not present

## 2016-07-13 DIAGNOSIS — G4733 Obstructive sleep apnea (adult) (pediatric): Secondary | ICD-10-CM

## 2016-07-13 DIAGNOSIS — R05 Cough: Secondary | ICD-10-CM

## 2016-07-13 DIAGNOSIS — R7309 Other abnormal glucose: Secondary | ICD-10-CM | POA: Diagnosis not present

## 2016-07-13 DIAGNOSIS — R059 Cough, unspecified: Secondary | ICD-10-CM

## 2016-07-13 DIAGNOSIS — Z6830 Body mass index (BMI) 30.0-30.9, adult: Secondary | ICD-10-CM | POA: Diagnosis not present

## 2016-07-13 HISTORY — DX: Cough: R05

## 2016-07-13 HISTORY — DX: Cough, unspecified: R05.9

## 2016-07-13 NOTE — Assessment & Plan Note (Signed)
Well treated on CPAP 9. Will try to add a chin strap to help w mouth leak. Compliance report reviewed. Follow annually or prn.

## 2016-07-13 NOTE — Progress Notes (Signed)
Subjective:    Patient ID: Ricardo [name], male    DOB: [birth year], 76 y.o.   MRN: [id number]  HPI 76 year old man, former smoker, with a history of known obstructive sleep apnea based on a remote polysomnogram back in .   He was on CPAP 2005-2010. He stopped it after he tried dental appliance - still uses it. It does help his OSA but it has altered his bite, causing him trouble. He is interested in in getting back on CPAP. He denies napping, falling asleep unintentionally. He is well rested as long as he uses his oral appliance  ROV 05/09/15 -- patient follows up for his obstructive sleep apnea. He underwent a home sleep study /16/17 that confirmed obstructive sleep apnea with titration to 9 cm of water.  Nasal mask with a chin strap.   ROV 07/07/15 -- follow-up visit for obstructive sleep apnea. He is now treated with CPAP at 9cm of water.  He is using reliably, wears it every night > confirmed on download. He believes it is helping him. Seldom gets up at night. He has no daytime sleepiness. Does not nap, does not fall asleep accidentally.   ROV 07/13/16 -- Patient has a history of former tobacco use. He follows up for obstructive sleep apnea set at 9 cm H2O. He reports that has been tolerating the mask well. He is using nasal mask without a chin strap. A compliance report is available - uses 100% of nights > 4 hours. He wants to retry the chin strap. He feels well rested during the day. He does not fall asleep during the day, does not nap. He does not snore with the mask on. Supplies up to date, in good repair. Reports that he has some daily cough with mucous, occasional rhinitis. He had a CXr w DR Sharlett Iles but I dont have it available to review today.    Review of Systems  Past Medical History:  Diagnosis Date  . Chest pain   . Depression   . Hemorrhoids   . History of pneumonia   . Hx of colonic polyps 10/31/2002   Hyperplastic   . Obstructive sleep apnea   . Peptic ulcer   . Syncope   .  Tachycardia      Family History  Problem Relation Age of Onset  . Stroke Father   . Hypertension Mother   . Pancreatic cancer Mother   . Anuerysm Brother     AAA  . Anuerysm Father     AAA     Social History   Social History  . Marital status: Married    Spouse name: N/A  . Number of children: 3  . 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 Smoking status: Former Smoker    Types: Pipe    Quit date: 04/05/1988  . Smokeless tobacco: Never Used  . Alcohol use Yes     Comment: occasionally  . Drug use: No  . Sexual activity: Not on file   Other Topics Concern  . Not on file   Social History Narrative  . No narrative on file     No Known Allergies   Outpatient Medications Prior to Visit  Medication Sig Dispense Refill  . aspirin 81 MG tablet Take 81 mg by mouth daily. Reported on 07/07/2015    . Multiple Vitamin (MULTIVITAMIN) tablet Take 1 tablet by mouth daily.    . naproxen  sodium (ANAPROX) 220 MG tablet Take 220 mg by mouth 2 (two) times daily with a meal.     No facility-administered medications prior to visit.          Objective:   Physical Exam Vitals:   07/13/16 1450  BP: 126/68  Pulse: 76  SpO2: 97%  Weight: 215 lb 12.8 oz (97.9 kg)  Height: 5\' 10"  (1.778 m)   Gen: Pleasant, well-nourished, in no distress,  normal affect  ENT: No lesions,  mouth clear,  oropharynx clear, no postnasal drip  Neck: No JVD, no TMG, no carotid bruits  Lungs: No use of accessory muscles, no dullness to percussion, clear without rales or rhonchi  Cardiovascular: RRR, heart sounds normal, no murmur or gallops, no peripheral edema  Musculoskeletal: No deformities, no cyanosis or clubbing  Neuro: alert, non focal  Skin: Warm, no lesions or rashes       Assessment & Plan:  Cough posibly related to rhinitis, he had a CXR w Dr Sharlett Iles but I do not have here to review. Have asked him to follow  up this result with him. Also recommended that he might try empiric anti-histamine to try to treat causative rhinitis.   Obstructive sleep apnea Well treated on CPAP 9. Will try to add a chin strap to help w mouth leak. Compliance report reviewed. Follow annually or prn.   Baltazar Apo, MD, PhD 07/13/2016, 3:08 PM [facility] [phone] or if no answer [phone]

## 2016-07-13 NOTE — Patient Instructions (Addendum)
Please continue your CPAP every night We will try adding a chin strap to see if this decreases leak from your mouth, obtain from [facility] Consider starting loratadine or zyrtec to see if this decreases your mucous and helps your cough Follow with Dr Lamonte Sakai in 12 months or sooner if you have any problems

## 2016-07-13 NOTE — Assessment & Plan Note (Signed)
posibly related to rhinitis, he had a CXR w Dr Sharlett Iles but I do not have here to review. Have asked him to follow up this result with him. Also recommended that he might try empiric anti-histamine to try to treat causative rhinitis.

## 2016-07-16 DIAGNOSIS — G4733 Obstructive sleep apnea (adult) (pediatric): Secondary | ICD-10-CM | POA: Diagnosis not present

## 2016-08-23 DIAGNOSIS — D0439 Carcinoma in situ of skin of other parts of face: Secondary | ICD-10-CM | POA: Diagnosis not present

## 2016-08-23 DIAGNOSIS — D0339 Melanoma in situ of other parts of face: Secondary | ICD-10-CM | POA: Diagnosis not present

## 2016-08-23 DIAGNOSIS — D1801 Hemangioma of skin and subcutaneous tissue: Secondary | ICD-10-CM | POA: Diagnosis not present

## 2016-08-23 DIAGNOSIS — D485 Neoplasm of uncertain behavior of skin: Secondary | ICD-10-CM | POA: Diagnosis not present

## 2016-08-23 DIAGNOSIS — D2262 Melanocytic nevi of left upper limb, including shoulder: Secondary | ICD-10-CM | POA: Diagnosis not present

## 2016-08-23 DIAGNOSIS — D225 Melanocytic nevi of trunk: Secondary | ICD-10-CM | POA: Diagnosis not present

## 2016-08-23 DIAGNOSIS — L821 Other seborrheic keratosis: Secondary | ICD-10-CM | POA: Diagnosis not present

## 2016-08-23 DIAGNOSIS — Z85828 Personal history of other malignant neoplasm of skin: Secondary | ICD-10-CM | POA: Diagnosis not present

## 2016-08-25 DIAGNOSIS — R69 Illness, unspecified: Secondary | ICD-10-CM | POA: Diagnosis not present

## 2016-09-06 DIAGNOSIS — L988 Other specified disorders of the skin and subcutaneous tissue: Secondary | ICD-10-CM | POA: Diagnosis not present

## 2016-09-06 DIAGNOSIS — Z85828 Personal history of other malignant neoplasm of skin: Secondary | ICD-10-CM | POA: Diagnosis not present

## 2016-09-06 DIAGNOSIS — D0339 Melanoma in situ of other parts of face: Secondary | ICD-10-CM | POA: Diagnosis not present

## 2016-09-06 DIAGNOSIS — L981 Factitial dermatitis: Secondary | ICD-10-CM | POA: Diagnosis not present

## 2016-09-07 DIAGNOSIS — D0339 Melanoma in situ of other parts of face: Secondary | ICD-10-CM | POA: Diagnosis not present

## 2016-09-08 DIAGNOSIS — D0339 Melanoma in situ of other parts of face: Secondary | ICD-10-CM | POA: Diagnosis not present

## 2016-10-19 DIAGNOSIS — G4733 Obstructive sleep apnea (adult) (pediatric): Secondary | ICD-10-CM | POA: Diagnosis not present

## 2016-11-04 DIAGNOSIS — Z08 Encounter for follow-up examination after completed treatment for malignant neoplasm: Secondary | ICD-10-CM | POA: Diagnosis not present

## 2016-12-13 DIAGNOSIS — Z125 Encounter for screening for malignant neoplasm of prostate: Secondary | ICD-10-CM | POA: Diagnosis not present

## 2016-12-13 DIAGNOSIS — I10 Essential (primary) hypertension: Secondary | ICD-10-CM | POA: Diagnosis not present

## 2016-12-13 DIAGNOSIS — R7309 Other abnormal glucose: Secondary | ICD-10-CM | POA: Diagnosis not present

## 2016-12-16 DIAGNOSIS — R69 Illness, unspecified: Secondary | ICD-10-CM | POA: Diagnosis not present

## 2016-12-16 DIAGNOSIS — Z1389 Encounter for screening for other disorder: Secondary | ICD-10-CM | POA: Diagnosis not present

## 2016-12-16 DIAGNOSIS — R7309 Other abnormal glucose: Secondary | ICD-10-CM | POA: Diagnosis not present

## 2016-12-16 DIAGNOSIS — Z1212 Encounter for screening for malignant neoplasm of rectum: Secondary | ICD-10-CM | POA: Diagnosis not present

## 2016-12-16 DIAGNOSIS — E784 Other hyperlipidemia: Secondary | ICD-10-CM | POA: Diagnosis not present

## 2016-12-16 DIAGNOSIS — Z0000 Encounter for general adult medical examination without abnormal findings: Secondary | ICD-10-CM | POA: Diagnosis not present

## 2016-12-16 DIAGNOSIS — I10 Essential (primary) hypertension: Secondary | ICD-10-CM | POA: Diagnosis not present

## 2016-12-16 DIAGNOSIS — N528 Other male erectile dysfunction: Secondary | ICD-10-CM | POA: Diagnosis not present

## 2016-12-16 DIAGNOSIS — Z23 Encounter for immunization: Secondary | ICD-10-CM | POA: Diagnosis not present

## 2016-12-16 DIAGNOSIS — Z6831 Body mass index (BMI) 31.0-31.9, adult: Secondary | ICD-10-CM | POA: Diagnosis not present

## 2016-12-16 DIAGNOSIS — G4733 Obstructive sleep apnea (adult) (pediatric): Secondary | ICD-10-CM | POA: Diagnosis not present

## 2017-01-07 ENCOUNTER — Encounter: Payer: Self-pay | Admitting: Internal Medicine

## 2017-02-14 DIAGNOSIS — L821 Other seborrheic keratosis: Secondary | ICD-10-CM | POA: Diagnosis not present

## 2017-02-14 DIAGNOSIS — D225 Melanocytic nevi of trunk: Secondary | ICD-10-CM | POA: Diagnosis not present

## 2017-02-14 DIAGNOSIS — D485 Neoplasm of uncertain behavior of skin: Secondary | ICD-10-CM | POA: Diagnosis not present

## 2017-02-14 DIAGNOSIS — Z85828 Personal history of other malignant neoplasm of skin: Secondary | ICD-10-CM | POA: Diagnosis not present

## 2017-02-14 DIAGNOSIS — Z85820 Personal history of malignant melanoma of skin: Secondary | ICD-10-CM | POA: Diagnosis not present

## 2017-02-14 DIAGNOSIS — L814 Other melanin hyperpigmentation: Secondary | ICD-10-CM | POA: Diagnosis not present

## 2017-03-01 DIAGNOSIS — G4733 Obstructive sleep apnea (adult) (pediatric): Secondary | ICD-10-CM | POA: Diagnosis not present

## 2017-03-02 DIAGNOSIS — R69 Illness, unspecified: Secondary | ICD-10-CM | POA: Diagnosis not present

## 2017-03-22 DIAGNOSIS — D485 Neoplasm of uncertain behavior of skin: Secondary | ICD-10-CM | POA: Diagnosis not present

## 2017-03-22 DIAGNOSIS — Z85828 Personal history of other malignant neoplasm of skin: Secondary | ICD-10-CM | POA: Diagnosis not present

## 2017-03-22 DIAGNOSIS — L988 Other specified disorders of the skin and subcutaneous tissue: Secondary | ICD-10-CM | POA: Diagnosis not present

## 2017-03-23 DIAGNOSIS — H524 Presbyopia: Secondary | ICD-10-CM | POA: Diagnosis not present

## 2017-03-23 DIAGNOSIS — D3131 Benign neoplasm of right choroid: Secondary | ICD-10-CM | POA: Diagnosis not present

## 2017-03-23 DIAGNOSIS — H35372 Puckering of macula, left eye: Secondary | ICD-10-CM | POA: Diagnosis not present

## 2017-05-16 DIAGNOSIS — R42 Dizziness and giddiness: Secondary | ICD-10-CM | POA: Diagnosis not present

## 2017-05-16 DIAGNOSIS — H6123 Impacted cerumen, bilateral: Secondary | ICD-10-CM | POA: Diagnosis not present

## 2017-05-16 DIAGNOSIS — Z6831 Body mass index (BMI) 31.0-31.9, adult: Secondary | ICD-10-CM | POA: Diagnosis not present

## 2017-05-16 DIAGNOSIS — Z1389 Encounter for screening for other disorder: Secondary | ICD-10-CM | POA: Diagnosis not present

## 2017-05-16 DIAGNOSIS — J00 Acute nasopharyngitis [common cold]: Secondary | ICD-10-CM | POA: Diagnosis not present

## 2017-05-16 DIAGNOSIS — H903 Sensorineural hearing loss, bilateral: Secondary | ICD-10-CM | POA: Insufficient documentation

## 2017-05-16 DIAGNOSIS — G4733 Obstructive sleep apnea (adult) (pediatric): Secondary | ICD-10-CM | POA: Diagnosis not present

## 2017-05-16 DIAGNOSIS — K219 Gastro-esophageal reflux disease without esophagitis: Secondary | ICD-10-CM | POA: Insufficient documentation

## 2017-05-16 DIAGNOSIS — M545 Low back pain: Secondary | ICD-10-CM | POA: Diagnosis not present

## 2017-05-16 DIAGNOSIS — I10 Essential (primary) hypertension: Secondary | ICD-10-CM | POA: Diagnosis not present

## 2017-05-16 DIAGNOSIS — R7309 Other abnormal glucose: Secondary | ICD-10-CM | POA: Diagnosis not present

## 2017-07-13 ENCOUNTER — Ambulatory Visit: Payer: Medicare HMO | Admitting: Emergency Medicine

## 2017-07-13 ENCOUNTER — Encounter: Payer: Self-pay | Admitting: Emergency Medicine

## 2017-07-13 DIAGNOSIS — G4733 Obstructive sleep apnea (adult) (pediatric): Secondary | ICD-10-CM

## 2017-07-13 DIAGNOSIS — J301 Allergic rhinitis due to pollen: Secondary | ICD-10-CM

## 2017-07-13 NOTE — Patient Instructions (Signed)
Please continue your CPAP every night as you are using it Call our office if you have any flaring of congestion or nasal drainage. If so we can probably recommend an anti-histamine to help.  Follow with Dr Lamonte Sakai in 12 months or sooner if you have any problems

## 2017-07-13 NOTE — Assessment & Plan Note (Signed)
Tolerating CPAP well. Good compliance. Good clinical benefit with better energy, less sleepiness. Plan to continue same. He just got a new machine this year - in good repair.

## 2017-07-13 NOTE — Progress Notes (Signed)
Subjective:    Patient ID: Ricardo [name], male    DOB: [birth year], 77 y.o.   MRN: [id number]  HPI 77 year old man, former smoker, with a history of known obstructive sleep apnea based on a remote polysomnogram back in .   He was on CPAP 2005-2010. He stopped it after he tried dental appliance - still uses it. It does help his OSA but it has altered his bite, causing him trouble. He is interested in in getting back on CPAP. He denies napping, falling asleep unintentionally. He is well rested as long as he uses his oral appliance  ROV 05/09/15 -- patient follows up for his obstructive sleep apnea. He underwent a home sleep study /16/17 that confirmed obstructive sleep apnea with titration to 9 cm of water.  Nasal mask with a chin strap.   ROV 07/07/15 -- follow-up visit for obstructive sleep apnea. He is now treated with CPAP at 9cm of water.  He is using reliably, wears it every night > confirmed on download. He believes it is helping him. Seldom gets up at night. He has no daytime sleepiness. Does not nap, does not fall asleep accidentally.   ROV 07/13/16 -- Patient has a history of former tobacco use. He follows up for obstructive sleep apnea set at 9 cm H2O. He reports that has been tolerating the mask well. He is using nasal mask without a chin strap. A compliance report is available - uses 100% of nights > 4 hours. He wants to retry the chin strap. He feels well rested during the day. He does not fall asleep during the day, does not nap. He does not snore with the mask on. Supplies up to date, in good repair. Reports that he has some daily cough with mucous, occasional rhinitis. He had a CXr w DR Sharlett Iles but I dont have it available to review today.   ROV 07/13/17 --patient has a history of obstructive sleep apnea, former tobacco use.  Also probably allergic rhinitis with some associated cough.  He has been compliant with CPAP 9 cmH2O.  He uses a nasal mask. His machine had to be replaced at the  beginning of the year. He had braces removed a month ago. He is well rested, good energy. May snore a little with the mask on. He has great compliance. No accidental naps, occasionally intentional naps. No falling asleep in inappropriate situations.  He is now on Vit D3, Magnesium, metformin. No real mucous or cough right now.   Compliance data 06/13/17- 07/12/17 > uses CPAP > 4h a night 100% of nights, minimal leak, AHI 2.2.    Review of Systems  Respiratory: Negative for cough.     Past Medical History:  Diagnosis Date  . Chest pain   . Depression   . Hemorrhoids   . History of pneumonia   . Hx of colonic polyps 10/31/2002   Hyperplastic   . Obstructive sleep apnea   . Peptic ulcer   . Syncope   . Tachycardia      Family History  Problem Relation Age of Onset  . Stroke Father   . Hypertension Mother   . Pancreatic cancer Mother   . Anuerysm Brother        AAA  . Anuerysm Father        AAA     Social History   Socioeconomic History  . Marital status: Married    Spouse name: Not on file  . Number of children: 3  .  Years of education: Not on file  . Highest education level: Not on file  Occupational History  . Occupation: Lobbyist: [employer]  . Financial resource strain: Not on file  . Food insecurity:    Worry: Not on file    Inability: Not on file  . Transportation needs:    Medical: Not on file    Non-medical: Not on file  Tobacco Use  . Smoking status: Former Smoker    Types: Pipe    Last attempt to quit: 04/05/1988    Years since quitting: 29.2  . Smokeless tobacco: Never Used  Substance and Sexual Activity  . Alcohol use: Yes    Comment: occasionally  . Drug use: No  . Sexual activity: Not on file  Lifestyle  . Physical activity:    Days per week: Not on file    Minutes per session: Not on file  . Stress: Not on file  Relationships  . Social connections:    Talks on phone: Not on file    Gets together: Not on  file    Attends religious service: Not on file    Active member of club or organization: Not on file    Attends meetings of clubs or organizations: Not on file    Relationship status: Not on file  . Intimate partner violence:    Fear of current or ex partner: Not on file    Emotionally abused: Not on file    Physically abused: Not on file    Forced sexual activity: Not on file  Other Topics Concern  . Not on file  Social History Narrative  . Not on file     No Known Allergies   Outpatient Medications Prior to Visit  Medication Sig Dispense Refill  . aspirin 81 MG tablet Take 81 mg by mouth daily. Reported on 07/07/2015    . Cholecalciferol (VITAMIN D3) 50000 units TABS Take 1 tablet by mouth daily.    Marland Kitchen escitalopram (LEXAPRO) 10 MG tablet Take 10 mg by mouth daily.    . magnesium gluconate (MAGONATE) 500 MG tablet Take 500 mg by mouth daily.    . metFORMIN (GLUCOPHAGE) 500 MG tablet Take by mouth daily.    . Multiple Vitamin (MULTIVITAMIN) tablet Take 1 tablet by mouth daily.    . Naproxen Sodium (ALEVE) 220 MG CAPS Take by mouth.     No facility-administered medications prior to visit.          Objective:   Physical Exam Vitals:   07/13/17 1401 07/13/17 1402  BP:  124/82  Pulse:  92  SpO2:  93%  Weight: 223 lb (101.2 kg)   Height: 5\' 11"  (1.803 m)    Gen: Pleasant, well-nourished, in no distress,  normal affect  ENT: No lesions,  mouth clear,  oropharynx clear, no postnasal drip  Neck: No JVD, no stridor  Lungs: No use of accessory muscles,  clear without rales or rhonchi  Cardiovascular: RRR, heart sounds normal, no murmur or gallops, no peripheral edema  Musculoskeletal: No deformities, no cyanosis or clubbing  Neuro: alert, non focal  Skin: Warm, no lesions or rashes       Assessment & Plan:  Obstructive sleep apnea Tolerating CPAP well. Good compliance. Good clinical benefit with better energy, less sleepiness. Plan to continue same. He just got a  new machine this year - in good repair.   Allergic rhinitis No symptoms right now. He will call if  these develop, can try anti-histamine if so   Baltazar Apo, MD, PhD 07/13/2017, 2:32 PM [facility] [phone] or if no answer [phone]

## 2017-07-13 NOTE — Assessment & Plan Note (Signed)
No symptoms right now. He will call if these develop, can try anti-histamine if so

## 2017-07-15 DIAGNOSIS — G4733 Obstructive sleep apnea (adult) (pediatric): Secondary | ICD-10-CM | POA: Diagnosis not present

## 2017-08-23 DIAGNOSIS — L570 Actinic keratosis: Secondary | ICD-10-CM | POA: Diagnosis not present

## 2017-08-23 DIAGNOSIS — Z85828 Personal history of other malignant neoplasm of skin: Secondary | ICD-10-CM | POA: Diagnosis not present

## 2017-08-23 DIAGNOSIS — L821 Other seborrheic keratosis: Secondary | ICD-10-CM | POA: Diagnosis not present

## 2017-08-23 DIAGNOSIS — Z85820 Personal history of malignant melanoma of skin: Secondary | ICD-10-CM | POA: Diagnosis not present

## 2017-08-23 DIAGNOSIS — L738 Other specified follicular disorders: Secondary | ICD-10-CM | POA: Diagnosis not present

## 2017-08-23 DIAGNOSIS — D225 Melanocytic nevi of trunk: Secondary | ICD-10-CM | POA: Diagnosis not present

## 2017-09-20 DIAGNOSIS — R69 Illness, unspecified: Secondary | ICD-10-CM | POA: Diagnosis not present

## 2017-10-20 DIAGNOSIS — G4733 Obstructive sleep apnea (adult) (pediatric): Secondary | ICD-10-CM | POA: Diagnosis not present

## 2018-01-05 DIAGNOSIS — I10 Essential (primary) hypertension: Secondary | ICD-10-CM | POA: Diagnosis not present

## 2018-01-05 DIAGNOSIS — Z125 Encounter for screening for malignant neoplasm of prostate: Secondary | ICD-10-CM | POA: Diagnosis not present

## 2018-01-05 DIAGNOSIS — R7309 Other abnormal glucose: Secondary | ICD-10-CM | POA: Diagnosis not present

## 2018-01-05 DIAGNOSIS — R82998 Other abnormal findings in urine: Secondary | ICD-10-CM | POA: Diagnosis not present

## 2018-01-05 DIAGNOSIS — E7849 Other hyperlipidemia: Secondary | ICD-10-CM | POA: Diagnosis not present

## 2018-01-12 DIAGNOSIS — Z23 Encounter for immunization: Secondary | ICD-10-CM | POA: Diagnosis not present

## 2018-01-12 DIAGNOSIS — Z0000 Encounter for general adult medical examination without abnormal findings: Secondary | ICD-10-CM | POA: Diagnosis not present

## 2018-01-12 DIAGNOSIS — R0982 Postnasal drip: Secondary | ICD-10-CM | POA: Diagnosis not present

## 2018-01-12 DIAGNOSIS — E7849 Other hyperlipidemia: Secondary | ICD-10-CM | POA: Diagnosis not present

## 2018-01-12 DIAGNOSIS — E668 Other obesity: Secondary | ICD-10-CM | POA: Diagnosis not present

## 2018-01-12 DIAGNOSIS — G4733 Obstructive sleep apnea (adult) (pediatric): Secondary | ICD-10-CM | POA: Diagnosis not present

## 2018-01-12 DIAGNOSIS — R7309 Other abnormal glucose: Secondary | ICD-10-CM | POA: Diagnosis not present

## 2018-01-12 DIAGNOSIS — M545 Low back pain: Secondary | ICD-10-CM | POA: Diagnosis not present

## 2018-01-12 DIAGNOSIS — I10 Essential (primary) hypertension: Secondary | ICD-10-CM | POA: Diagnosis not present

## 2018-01-12 DIAGNOSIS — R69 Illness, unspecified: Secondary | ICD-10-CM | POA: Diagnosis not present

## 2018-01-13 DIAGNOSIS — Z1212 Encounter for screening for malignant neoplasm of rectum: Secondary | ICD-10-CM | POA: Diagnosis not present

## 2018-01-26 DIAGNOSIS — G4733 Obstructive sleep apnea (adult) (pediatric): Secondary | ICD-10-CM | POA: Diagnosis not present

## 2018-01-27 DIAGNOSIS — I10 Essential (primary) hypertension: Secondary | ICD-10-CM | POA: Diagnosis not present

## 2018-02-14 DIAGNOSIS — D2262 Melanocytic nevi of left upper limb, including shoulder: Secondary | ICD-10-CM | POA: Diagnosis not present

## 2018-02-14 DIAGNOSIS — L821 Other seborrheic keratosis: Secondary | ICD-10-CM | POA: Diagnosis not present

## 2018-02-14 DIAGNOSIS — L570 Actinic keratosis: Secondary | ICD-10-CM | POA: Diagnosis not present

## 2018-02-14 DIAGNOSIS — D2261 Melanocytic nevi of right upper limb, including shoulder: Secondary | ICD-10-CM | POA: Diagnosis not present

## 2018-02-14 DIAGNOSIS — D225 Melanocytic nevi of trunk: Secondary | ICD-10-CM | POA: Diagnosis not present

## 2018-02-14 DIAGNOSIS — Z85828 Personal history of other malignant neoplasm of skin: Secondary | ICD-10-CM | POA: Diagnosis not present

## 2018-02-14 DIAGNOSIS — L820 Inflamed seborrheic keratosis: Secondary | ICD-10-CM | POA: Diagnosis not present

## 2018-02-14 DIAGNOSIS — D1801 Hemangioma of skin and subcutaneous tissue: Secondary | ICD-10-CM | POA: Diagnosis not present

## 2018-02-14 DIAGNOSIS — Z85820 Personal history of malignant melanoma of skin: Secondary | ICD-10-CM | POA: Diagnosis not present

## 2018-02-22 DIAGNOSIS — R69 Illness, unspecified: Secondary | ICD-10-CM | POA: Diagnosis not present

## 2018-04-10 DIAGNOSIS — H524 Presbyopia: Secondary | ICD-10-CM | POA: Diagnosis not present

## 2018-04-10 DIAGNOSIS — Z961 Presence of intraocular lens: Secondary | ICD-10-CM | POA: Diagnosis not present

## 2018-04-28 DIAGNOSIS — J209 Acute bronchitis, unspecified: Secondary | ICD-10-CM | POA: Diagnosis not present

## 2018-05-08 DIAGNOSIS — G4733 Obstructive sleep apnea (adult) (pediatric): Secondary | ICD-10-CM | POA: Diagnosis not present

## 2018-05-09 DIAGNOSIS — R69 Illness, unspecified: Secondary | ICD-10-CM | POA: Diagnosis not present

## 2018-05-17 DIAGNOSIS — H903 Sensorineural hearing loss, bilateral: Secondary | ICD-10-CM | POA: Diagnosis not present

## 2018-07-18 ENCOUNTER — Ambulatory Visit: Payer: Medicare HMO | Admitting: Emergency Medicine

## 2018-07-18 DIAGNOSIS — Z1331 Encounter for screening for depression: Secondary | ICD-10-CM | POA: Diagnosis not present

## 2018-07-18 DIAGNOSIS — R69 Illness, unspecified: Secondary | ICD-10-CM | POA: Diagnosis not present

## 2018-07-18 DIAGNOSIS — I10 Essential (primary) hypertension: Secondary | ICD-10-CM | POA: Diagnosis not present

## 2018-07-18 DIAGNOSIS — G4733 Obstructive sleep apnea (adult) (pediatric): Secondary | ICD-10-CM | POA: Diagnosis not present

## 2018-07-18 DIAGNOSIS — R739 Hyperglycemia, unspecified: Secondary | ICD-10-CM | POA: Diagnosis not present

## 2018-08-08 DIAGNOSIS — D225 Melanocytic nevi of trunk: Secondary | ICD-10-CM | POA: Diagnosis not present

## 2018-08-08 DIAGNOSIS — Z85828 Personal history of other malignant neoplasm of skin: Secondary | ICD-10-CM | POA: Diagnosis not present

## 2018-08-08 DIAGNOSIS — Z85820 Personal history of malignant melanoma of skin: Secondary | ICD-10-CM | POA: Diagnosis not present

## 2018-08-08 DIAGNOSIS — D2261 Melanocytic nevi of right upper limb, including shoulder: Secondary | ICD-10-CM | POA: Diagnosis not present

## 2018-08-08 DIAGNOSIS — L570 Actinic keratosis: Secondary | ICD-10-CM | POA: Diagnosis not present

## 2018-08-08 DIAGNOSIS — D1801 Hemangioma of skin and subcutaneous tissue: Secondary | ICD-10-CM | POA: Diagnosis not present

## 2018-08-08 DIAGNOSIS — L821 Other seborrheic keratosis: Secondary | ICD-10-CM | POA: Diagnosis not present

## 2018-08-08 DIAGNOSIS — L814 Other melanin hyperpigmentation: Secondary | ICD-10-CM | POA: Diagnosis not present

## 2018-08-08 DIAGNOSIS — D2262 Melanocytic nevi of left upper limb, including shoulder: Secondary | ICD-10-CM | POA: Diagnosis not present

## 2018-10-24 DIAGNOSIS — G4733 Obstructive sleep apnea (adult) (pediatric): Secondary | ICD-10-CM | POA: Diagnosis not present

## 2018-11-01 NOTE — Progress Notes (Signed)
@Patient  ID: Ricardo [name], male    DOB: February 03, 1941, 78 y.o.   MRN: [id number]  Chief Complaint  Patient presents with  . Follow-up    cpap follow up - no complaints - feeling better with it    Referring provider: Leanna Battles, MD  HPI:  78 year old male former smoker followed in our office for moderate obstructive sleep apnea, managed on CPAP.  Previously tried on oral appliance but did not tolerate as it altered his bite.  PMH: Allergic rhinitis, cough Smoker/ Smoking History: Former smoker Maintenance: None Pt of: Dr. Lamonte Sakai  11/02/2018  - Visit   78 year old male former smoker followed in our office for moderate obstructive sleep apnea.  2016 home sleep study showed an AHI of 19.7.  Patient was initially tried on oral appliance but did not tolerate his altered his bite.  Patient now managed on CPAP therapy.  Patient reports that it is been going well.  CPAP compliance report confirms this.  See compliance report listed below:  10/02/2018-10/31/2018 20-30 had a last 30 days used, 30 those days greater than 4 hours, average usage 7 hours and 45 minutes, CPAP set pressure of 9, AHI 1.6  Patient reports clinical improvement since using CPAP.  If he misses a day he feels that clinically.  He always sleeps with it.   Tests:   03/19/2015-home sleep study- AHI 19.7-hour, SaO2 low 80%  FENO:  No results found for: NITRICOXIDE  PFT: No flowsheet data found.  Imaging: No results found.    Specialty Problems      Pulmonary Problems   Allergic rhinitis    Qualifier: Diagnosis of  By: [name] RN, [name]        Obstructive sleep apnea    03/19/2015-home sleep study- AHI 19.7-hour, SaO2 low 80%        Cough      No Known Allergies  Immunization History  Administered Date(s) Administered  . Influenza, High Dose Seasonal PF 03/15/2016  . Influenza,inj,Quad PF,6+ Mos 12/04/2014  . Influenza-Unspecified 12/04/2017    Past Medical History:  Diagnosis Date  .  Chest pain   . Depression   . Hemorrhoids   . History of pneumonia   . Hx of colonic polyps 10/31/2002   Hyperplastic   . Obstructive sleep apnea   . Peptic ulcer   . Syncope   . Tachycardia     Tobacco History: Social History   Tobacco Use  Smoking Status Former Smoker  . Types: Pipe  . Quit date: 04/05/1988  . Years since quitting: 30.5  Smokeless Tobacco Never Used   Counseling given: Yes   Continue to not smoke  Outpatient Encounter Medications as of 11/02/2018  Medication Sig  . Ascorbic Acid (VITAMIN C) 500 MG CAPS Take 500 mg by mouth. With zinc  . aspirin 81 MG tablet Take 81 mg by mouth daily. Reported on 07/07/2015  . Cholecalciferol (VITAMIN D3) 50000 units TABS Take 1 tablet by mouth daily.  Marland Kitchen escitalopram (LEXAPRO) 10 MG tablet Take 10 mg by mouth daily.  . magnesium gluconate (MAGONATE) 500 MG tablet Take 500 mg by mouth daily.  . metFORMIN (GLUCOPHAGE) 500 MG tablet Take by mouth daily.  . Multiple Vitamin (MULTIVITAMIN) tablet Take 1 tablet by mouth daily.  . Naproxen Sodium (ALEVE) 220 MG CAPS Take by mouth.  . irbesartan-hydrochlorothiazide (AVALIDE) 150-12.5 MG tablet Take 1 tablet by mouth daily.   No facility-administered encounter medications on file as of 11/02/2018.  Review of Systems  Review of Systems  Constitutional: Negative for activity change, chills, fatigue, fever and unexpected weight change.  HENT: Negative for postnasal drip, rhinorrhea, sinus pressure, sinus pain and sore throat.   Eyes: Negative.   Respiratory: Negative for cough, shortness of breath and wheezing.   Cardiovascular: Negative for chest pain and palpitations.  Gastrointestinal: Negative for constipation, diarrhea, nausea and vomiting.  Endocrine: Negative.   Genitourinary: Negative.   Musculoskeletal: Negative.   Skin: Negative.   Neurological: Negative for dizziness and headaches.  Psychiatric/Behavioral: Negative.  Negative for dysphoric mood. The patient is  not nervous/anxious.   All other systems reviewed and are negative.    Physical Exam  BP 118/76 (BP Location: Left Arm, Patient Position: Sitting, Cuff Size: Normal)   Pulse 73   Temp 98.2 F (36.8 C)   Ht 5\' 10"  (1.778 m)   Wt 229 lb (103.9 kg)   SpO2 95%   BMI 32.86 kg/m   Wt Readings from Last 5 Encounters:  11/02/18 229 lb (103.9 kg)  07/13/17 223 lb (101.2 kg)  07/13/16 215 lb 12.8 oz (97.9 kg)  07/07/15 223 lb (101.2 kg)  05/09/15 221 lb (100.2 kg)     Physical Exam Vitals signs and nursing note reviewed.  Constitutional:      General: He is not in acute distress.    Appearance: Normal appearance. He is obese.  HENT:     Head: Normocephalic and atraumatic.     Right Ear: Hearing and external ear normal.     Left Ear: Hearing and external ear normal.     Nose: Nose normal. No mucosal edema or rhinorrhea.     Right Turbinates: Not enlarged.     Left Turbinates: Not enlarged.     Mouth/Throat:     Mouth: Mucous membranes are dry.     Pharynx: Oropharynx is clear. No oropharyngeal exudate.  Eyes:     Pupils: Pupils are equal, round, and reactive to light.  Neck:     Musculoskeletal: Normal range of motion.  Cardiovascular:     Rate and Rhythm: Normal rate and regular rhythm.     Pulses: Normal pulses.     Heart sounds: Normal heart sounds. No murmur.  Pulmonary:     Effort: Pulmonary effort is normal.     Breath sounds: Normal breath sounds. No decreased breath sounds, wheezing or rales.  Musculoskeletal:     Right lower leg: No edema.     Left lower leg: No edema.  Lymphadenopathy:     Cervical: No cervical adenopathy.  Skin:    General: Skin is warm and dry.     Capillary Refill: Capillary refill takes less than 2 seconds.     Findings: No erythema or rash.  Neurological:     General: No focal deficit present.     Mental Status: He is alert and oriented to person, place, and time.     Motor: No weakness.     Coordination: Coordination normal.      Gait: Gait is intact. Gait normal.  Psychiatric:        Mood and Affect: Mood normal.        Behavior: Behavior normal. Behavior is cooperative.        Thought Content: Thought content normal.        Judgment: Judgment normal.      Lab Results:  CBC No results found for: WBC, RBC, HGB, HCT, PLT, MCV, MCH, MCHC, RDW, LYMPHSABS, MONOABS, EOSABS, BASOSABS  BMET No results found for: NA, K, CL, CO2, GLUCOSE, BUN, CREATININE, CALCIUM, GFRNONAA, GFRAA  BNP No results found for: BNP  ProBNP No results found for: PROBNP    Assessment & Plan:   Obstructive sleep apnea Plan: Continue CPAP therapy Follow-up in 1 year Follow-up with our office sooner if you have issues using your CPAP or have worsened sleep    Return in about 1 year (around 11/02/2019), or if symptoms worsen or fail to improve, for Follow up with Dr. Lamonte Sakai, Follow up with Wyn Quaker FNP-C.   Lauraine Rinne, NP 11/02/2018   This appointment was 16 minutes long with over 50% of the time in direct face-to-face patient care, assessment, plan of care, and follow-up.

## 2018-11-02 ENCOUNTER — Ambulatory Visit (INDEPENDENT_AMBULATORY_CARE_PROVIDER_SITE_OTHER): Payer: Medicare HMO | Admitting: Pulmonary Disease

## 2018-11-02 ENCOUNTER — Ambulatory Visit: Payer: Medicare HMO | Admitting: Emergency Medicine

## 2018-11-02 ENCOUNTER — Encounter: Payer: Self-pay | Admitting: Pulmonary Disease

## 2018-11-02 ENCOUNTER — Other Ambulatory Visit: Payer: Self-pay

## 2018-11-02 VITALS — BP 118/76 | HR 73 | Temp 98.2°F | Ht 70.0 in | Wt 229.0 lb

## 2018-11-02 DIAGNOSIS — G4733 Obstructive sleep apnea (adult) (pediatric): Secondary | ICD-10-CM | POA: Diagnosis not present

## 2018-11-02 NOTE — Assessment & Plan Note (Signed)
Plan: Continue CPAP therapy Follow-up in 1 year Follow-up with our office sooner if you have issues using your CPAP or have worsened sleep

## 2018-11-02 NOTE — Patient Instructions (Addendum)
We recommend that you continue using your CPAP daily >>>Keep up the hard work using your device >>> Goal should be wearing this for the entire night that you are sleeping, at least 4 to 6 hours  Remember:  . Do not drive or operate heavy machinery if tired or drowsy.  . Please notify the supply company and office if you are unable to use your device regularly due to missing supplies or machine being broken.  . Work on maintaining a healthy weight and following your recommended nutrition plan  . Maintain proper daily exercise and movement  . Maintaining proper use of your device can also help improve management of other chronic illnesses such as: Blood pressure, blood sugars, and weight management.   BiPAP/ CPAP Cleaning:  >>>Clean weekly, with Dawn soap, and bottle brush.  Set up to air dry.   Return in about 1 year (around 11/02/2019), or if symptoms worsen or fail to improve, for Follow up with Dr. Lamonte Sakai, Follow up with Wyn Quaker FNP-C.   Coronavirus (COVID-19) Are you at risk?  Are you at risk for the Coronavirus (COVID-19)?  To be considered HIGH RISK for Coronavirus (COVID-19), you have to meet the following criteria:  . Traveled to [place], [place], [place], [place] or [place]; or in the [place] to [place], [place], [place], or [place]; and have fever, cough, and shortness of breath within the last 2 weeks of travel OR . Been in close contact with a person diagnosed with COVID-19 within the last 2 weeks and have fever, cough, and shortness of breath . IF YOU DO NOT MEET THESE CRITERIA, YOU ARE CONSIDERED LOW RISK FOR COVID-19.  What to do if you are HIGH RISK for COVID-19?  Marland Kitchen If you are having a medical emergency, call 911. . Seek medical care right away. Before you go to a doctor's office, urgent care or emergency department, call ahead and tell them about your recent travel, contact with someone diagnosed with COVID-19, and your symptoms. You should receive  instructions from your physician's office regarding next steps of care.  . When you arrive at healthcare provider, tell the healthcare staff immediately you have returned from visiting [place], [place], [place], [place] or [place]; or traveled in the [place] to [place], [place], [place], or [place]; in the last two weeks or you have been in close contact with a person diagnosed with COVID-19 in the last 2 weeks.   . Tell the health care staff about your symptoms: fever, cough and shortness of breath. . After you have been seen by a medical provider, you will be either: o Tested for (COVID-19) and discharged home on quarantine except to seek medical care if symptoms worsen, and asked to  - Stay home and avoid contact with others until you get your results (4-5 days)  - Avoid travel on public transportation if possible (such as bus, train, or airplane) or o Sent to the Emergency Department by EMS for evaluation, COVID-19 testing, and possible admission depending on your condition and test results.  What to do if you are LOW RISK for COVID-19?  Reduce your risk of any infection by using the same precautions used for avoiding the common cold or flu:  Marland Kitchen Wash your hands often with soap and warm water for at least 20 seconds.  If soap and water are not readily available, use an alcohol-based hand sanitizer with at least 60% alcohol.  . If coughing or sneezing,  cover your mouth and nose by coughing or sneezing into the elbow areas of your shirt or coat, into a tissue or into your sleeve (not your hands). . Avoid shaking hands with others and consider head nods or verbal greetings only. . Avoid touching your eyes, nose, or mouth with unwashed hands.  . Avoid close contact with people who are sick. . Avoid places or events with large numbers of people in one location, like concerts or sporting events. . Carefully consider travel plans you have or are making. . If you are planning any travel  outside or inside the [place], visit the CDC's Travelers' Health webpage for the latest health notices. . If you have some symptoms but not all symptoms, continue to monitor at home and seek medical attention if your symptoms worsen. . If you are having a medical emergency, call 911.   [place] / e-Visit: [email or web address]         [facility]: [facility]: [phone]                   [facility]: [phone]           It is flu season:   >>> Best ways to protect herself from the flu: Receive the yearly flu vaccine, practice good hand hygiene washing with soap and also using hand sanitizer when available, eat a nutritious meals, get adequate rest, hydrate appropriately   Please contact the office if your symptoms worsen or you have concerns that you are not improving.   Thank you for choosing [facility] for your healthcare, and for allowing [place] to partner with you on your healthcare journey. I am thankful to be able to provide care to you today.   Wyn Quaker FNP-C     Living With Sleep Apnea Sleep apnea is a condition in which breathing pauses or becomes shallow during sleep. Sleep apnea is most commonly caused by a collapsed or blocked airway. People with sleep apnea snore loudly and have times when they gasp and stop breathing for 10 seconds or more during sleep. This happens over and over during the night. This disrupts your sleep and keeps your body from getting the rest that it needs, which can cause tiredness and lack of energy (fatigue) during the day. The breaks in breathing also interrupt the deep sleep that you need to feel rested. Even if you do not completely wake up from the gaps in breathing, your sleep may not be restful. You may also have a headache in the morning and low energy during the day, and you  may feel anxious or depressed. How can sleep apnea affect me? Sleep apnea increases your chances of extreme tiredness during the day (daytime fatigue). It can also increase your risk for health conditions, such as:  Heart attack.  Stroke.  Diabetes.  Heart failure.  Irregular heartbeat.  High blood pressure. If you have daytime fatigue as a result of sleep apnea, you may be more likely to:  Perform poorly at school or work.  Fall asleep while driving.  Have difficulty with attention.  Develop depression or anxiety.  Become severely overweight (obese).  Have sexual dysfunction. What actions can I take to manage sleep apnea? Sleep apnea treatment   If you were given a device to open your airway while you sleep, use it only as told by your health care provider. You may be  given: ? An oral appliance. This is a custom-made mouthpiece that shifts your lower jaw forward. ? A continuous positive airway pressure (CPAP) device. This device blows air through a mask when you breathe out (exhale). ? A nasal expiratory positive airway pressure (EPAP) device. This device has valves that you put into each nostril. ? A bi-level positive airway pressure (BPAP) device. This device blows air through a mask when you breathe in (inhale) and breathe out (exhale).  You may need surgery if other treatments do not work for you. Sleep habits  Go to sleep and wake up at the same time every day. This helps set your internal clock (circadian rhythm) for sleeping. ? If you stay up later than usual, such as on weekends, try to get up in the morning within 2 hours of your normal wake time.  Try to get at least 7-9 hours of sleep each night.  Stop computer, tablet, and mobile phone use a few hours before bedtime.  Do not take long naps during the day. If you nap, limit it to 30 minutes.  Have a relaxing bedtime routine. Reading or listening to music may relax you and help you sleep.  Use your  bedroom only for sleep. ? Keep your television and computer out of your bedroom. ? Keep your bedroom cool, dark, and quiet. ? Use a supportive mattress and pillows.  Follow your health care provider's instructions for other changes to sleep habits. Nutrition  Do not eat heavy meals in the evening.  Do not have caffeine in the later part of the day. The effects of caffeine can last for more than 5 hours.  Follow your health care provider's or dietitian's instructions for any diet changes. Lifestyle      Do not drink alcohol before bedtime. Alcohol can cause you to fall asleep at first, but then it can cause you to wake up in the middle of the night and have trouble getting back to sleep.  Do not use any products that contain nicotine or tobacco, such as cigarettes and e-cigarettes. If you need help quitting, ask your health care provider. Medicines  Take over-the-counter and prescription medicines only as told by your health care provider.  Do not use over-the-counter sleep medicine. You can become dependent on this medicine, and it can make sleep apnea worse.  Do not use medicines, such as sedatives and narcotics, unless told by your health care provider. Activity  Exercise on most days, but avoid exercising in the evening. Exercising near bedtime can interfere with sleeping.  If possible, spend time outside every day. Natural light helps regulate your circadian rhythm. General information  Lose weight if you need to, and maintain a healthy weight.  Keep all follow-up visits as told by your health care provider. This is important.  If you are having surgery, make sure to tell your health care provider that you have sleep apnea. You may need to bring your device with you. Where to find more information Learn more about sleep apnea and daytime fatigue from:  American Sleep Association: sleepassociation.[place]: sleepfoundation.org  National Heart,  Lung, and Blood Institute: https://www.hartman-hill.biz/ Summary  Sleep apnea can cause daytime fatigue and other serious health conditions.  Both sleep apnea and daytime fatigue can be bad for your health and well-being.  You may need to wear a device while sleeping to help keep your airway open.  If you are having surgery, make sure to tell your health care provider  that you have sleep apnea. You may need to bring your device with you.  Making changes to sleep habits, diet, lifestyle, and activity can help you manage sleep apnea. This information is not intended to replace advice given to you by your health care provider. Make sure you discuss any questions you have with your health care provider. Document Released: 06/16/2017 Document Revised: 07/14/2018 Document Reviewed: 06/16/2017 Elsevier Patient Education  [place].     CPAP and BPAP Information CPAP and BPAP are methods of helping a person breathe with the use of air pressure. CPAP stands for "continuous positive airway pressure." BPAP stands for "bi-level positive airway pressure." In both methods, air is blown through your nose or mouth and into your air passages to help you breathe well. CPAP and BPAP use different amounts of pressure to blow air. With CPAP, the amount of pressure stays the same while you breathe in and out. With BPAP, the amount of pressure is increased when you breathe in (inhale) so that you can take larger breaths. Your health care provider will recommend whether CPAP or BPAP would be more helpful for you. Why are CPAP and BPAP treatments used? CPAP or BPAP can be helpful if you have:  Sleep apnea.  Chronic obstructive pulmonary disease (COPD).  Heart failure.  Medical conditions that weaken the muscles of the chest including muscular dystrophy, or neurological diseases such as amyotrophic lateral sclerosis (ALS).  Other problems that cause breathing to be weak, abnormal, or difficult. CPAP is most  commonly used for obstructive sleep apnea (OSA) to keep the airways from collapsing when the muscles relax during sleep. How is CPAP or BPAP administered? Both CPAP and BPAP are provided by a small machine with a flexible plastic tube that attaches to a plastic mask. You wear the mask. Air is blown through the mask into your nose or mouth. The amount of pressure that is used to blow the air can be adjusted on the machine. Your health care provider will determine the pressure setting that should be used based on your individual needs. When should CPAP or BPAP be used? In most cases, the mask only needs to be worn during sleep. Generally, the mask needs to be worn throughout the night and during any daytime naps. People with certain medical conditions may also need to wear the mask at other times when they are awake. Follow instructions from your health care provider about when to use the machine. What are some tips for using the mask?   Because the mask needs to be snug, some people feel trapped or closed-in (claustrophobic) when first using the mask. If you feel this way, you may need to get used to the mask. One way to do this is by holding the mask loosely over your nose or mouth and then gradually applying the mask more snugly. You can also gradually increase the amount of time that you use the mask.  Masks are available in various types and sizes. Some fit over your mouth and nose while others fit over just your nose. If your mask does not fit well, talk with your health care provider about getting a different one.  If you are using a mask that fits over your nose and you tend to breathe through your mouth, a chin strap may be applied to help keep your mouth closed.  The CPAP and BPAP machines have alarms that may sound if the mask comes off or develops a leak.  If  you have trouble with the mask, it is very important that you talk with your health care provider about finding a way to make the  mask easier to tolerate. Do not stop using the mask. Stopping the use of the mask could have a negative impact on your health. What are some tips for using the machine?  Place your CPAP or BPAP machine on a secure table or stand near an electrical outlet.  Know where the on/off switch is located on the machine.  Follow instructions from your health care provider about how to set the pressure on your machine and when you should use it.  Do not eat or drink while the CPAP or BPAP machine is on. Food or fluids could get pushed into your lungs by the pressure of the CPAP or BPAP.  Do not smoke. Tobacco smoke residue can damage the machine.  For home use, CPAP and BPAP machines can be rented or purchased through home health care companies. Many different brands of machines are available. Renting a machine before purchasing may help you find out which particular machine works well for you.  Keep the CPAP or BPAP machine and attachments clean. Ask your health care provider for specific instructions. Get help right away if:  You have redness or open areas around your nose or mouth where the mask fits.  You have trouble using the CPAP or BPAP machine.  You cannot tolerate wearing the CPAP or BPAP mask.  You have pain, discomfort, and bloating in your abdomen. Summary  CPAP and BPAP are methods of helping a person breathe with the use of air pressure.  Both CPAP and BPAP are provided by a small machine with a flexible plastic tube that attaches to a plastic mask.  If you have trouble with the mask, it is very important that you talk with your health care provider about finding a way to make the mask easier to tolerate. This information is not intended to replace advice given to you by your health care provider. Make sure you discuss any questions you have with your health care provider. Document Released: 12/19/2003 Document Revised: 07/12/2018 Document Reviewed: 02/09/2016 Elsevier Patient  Education  2020 [employer].

## 2018-11-24 DIAGNOSIS — I10 Essential (primary) hypertension: Secondary | ICD-10-CM | POA: Diagnosis not present

## 2018-11-24 DIAGNOSIS — R001 Bradycardia, unspecified: Secondary | ICD-10-CM | POA: Diagnosis not present

## 2018-11-24 DIAGNOSIS — R42 Dizziness and giddiness: Secondary | ICD-10-CM | POA: Diagnosis not present

## 2018-11-24 DIAGNOSIS — G4733 Obstructive sleep apnea (adult) (pediatric): Secondary | ICD-10-CM | POA: Diagnosis not present

## 2018-11-24 DIAGNOSIS — R739 Hyperglycemia, unspecified: Secondary | ICD-10-CM | POA: Diagnosis not present

## 2018-11-24 DIAGNOSIS — Z23 Encounter for immunization: Secondary | ICD-10-CM | POA: Diagnosis not present

## 2018-12-20 DIAGNOSIS — I10 Essential (primary) hypertension: Secondary | ICD-10-CM | POA: Diagnosis not present

## 2018-12-25 DIAGNOSIS — I10 Essential (primary) hypertension: Secondary | ICD-10-CM | POA: Diagnosis not present

## 2018-12-25 DIAGNOSIS — G4733 Obstructive sleep apnea (adult) (pediatric): Secondary | ICD-10-CM | POA: Diagnosis not present

## 2019-01-08 DIAGNOSIS — Z125 Encounter for screening for malignant neoplasm of prostate: Secondary | ICD-10-CM | POA: Diagnosis not present

## 2019-01-08 DIAGNOSIS — E7849 Other hyperlipidemia: Secondary | ICD-10-CM | POA: Diagnosis not present

## 2019-01-08 DIAGNOSIS — R739 Hyperglycemia, unspecified: Secondary | ICD-10-CM | POA: Diagnosis not present

## 2019-01-15 DIAGNOSIS — E669 Obesity, unspecified: Secondary | ICD-10-CM | POA: Diagnosis not present

## 2019-01-15 DIAGNOSIS — R0982 Postnasal drip: Secondary | ICD-10-CM | POA: Diagnosis not present

## 2019-01-15 DIAGNOSIS — R69 Illness, unspecified: Secondary | ICD-10-CM | POA: Diagnosis not present

## 2019-01-15 DIAGNOSIS — Z1339 Encounter for screening examination for other mental health and behavioral disorders: Secondary | ICD-10-CM | POA: Diagnosis not present

## 2019-01-15 DIAGNOSIS — I10 Essential (primary) hypertension: Secondary | ICD-10-CM | POA: Diagnosis not present

## 2019-01-15 DIAGNOSIS — M545 Low back pain: Secondary | ICD-10-CM | POA: Diagnosis not present

## 2019-01-15 DIAGNOSIS — R739 Hyperglycemia, unspecified: Secondary | ICD-10-CM | POA: Diagnosis not present

## 2019-01-15 DIAGNOSIS — E785 Hyperlipidemia, unspecified: Secondary | ICD-10-CM | POA: Diagnosis not present

## 2019-01-15 DIAGNOSIS — G4733 Obstructive sleep apnea (adult) (pediatric): Secondary | ICD-10-CM | POA: Diagnosis not present

## 2019-01-15 DIAGNOSIS — Z0000 Encounter for general adult medical examination without abnormal findings: Secondary | ICD-10-CM | POA: Diagnosis not present

## 2019-01-15 DIAGNOSIS — Z1212 Encounter for screening for malignant neoplasm of rectum: Secondary | ICD-10-CM | POA: Diagnosis not present

## 2019-01-15 DIAGNOSIS — R82998 Other abnormal findings in urine: Secondary | ICD-10-CM | POA: Diagnosis not present

## 2019-02-08 DIAGNOSIS — Z85828 Personal history of other malignant neoplasm of skin: Secondary | ICD-10-CM | POA: Diagnosis not present

## 2019-02-08 DIAGNOSIS — L821 Other seborrheic keratosis: Secondary | ICD-10-CM | POA: Diagnosis not present

## 2019-02-08 DIAGNOSIS — L218 Other seborrheic dermatitis: Secondary | ICD-10-CM | POA: Diagnosis not present

## 2019-02-08 DIAGNOSIS — D1801 Hemangioma of skin and subcutaneous tissue: Secondary | ICD-10-CM | POA: Diagnosis not present

## 2019-02-08 DIAGNOSIS — L820 Inflamed seborrheic keratosis: Secondary | ICD-10-CM | POA: Diagnosis not present

## 2019-02-08 DIAGNOSIS — L570 Actinic keratosis: Secondary | ICD-10-CM | POA: Diagnosis not present

## 2019-02-08 DIAGNOSIS — D225 Melanocytic nevi of trunk: Secondary | ICD-10-CM | POA: Diagnosis not present

## 2019-02-08 DIAGNOSIS — Z85820 Personal history of malignant melanoma of skin: Secondary | ICD-10-CM | POA: Diagnosis not present

## 2019-06-06 DIAGNOSIS — H524 Presbyopia: Secondary | ICD-10-CM | POA: Diagnosis not present

## 2019-06-06 DIAGNOSIS — Z961 Presence of intraocular lens: Secondary | ICD-10-CM | POA: Diagnosis not present

## 2019-06-07 DIAGNOSIS — R69 Illness, unspecified: Secondary | ICD-10-CM | POA: Diagnosis not present

## 2019-06-08 DIAGNOSIS — M79646 Pain in unspecified finger(s): Secondary | ICD-10-CM | POA: Diagnosis not present

## 2019-06-08 DIAGNOSIS — I10 Essential (primary) hypertension: Secondary | ICD-10-CM | POA: Diagnosis not present

## 2019-06-08 DIAGNOSIS — R739 Hyperglycemia, unspecified: Secondary | ICD-10-CM | POA: Diagnosis not present

## 2019-06-08 DIAGNOSIS — G4733 Obstructive sleep apnea (adult) (pediatric): Secondary | ICD-10-CM | POA: Diagnosis not present

## 2019-06-08 DIAGNOSIS — R252 Cramp and spasm: Secondary | ICD-10-CM | POA: Diagnosis not present

## 2019-06-08 DIAGNOSIS — E669 Obesity, unspecified: Secondary | ICD-10-CM | POA: Diagnosis not present

## 2019-06-08 DIAGNOSIS — Z1331 Encounter for screening for depression: Secondary | ICD-10-CM | POA: Diagnosis not present

## 2019-06-12 ENCOUNTER — Other Ambulatory Visit: Payer: Self-pay | Admitting: Internal Medicine

## 2019-06-12 DIAGNOSIS — I10 Essential (primary) hypertension: Secondary | ICD-10-CM

## 2019-06-19 ENCOUNTER — Ambulatory Visit
Admission: RE | Admit: 2019-06-19 | Discharge: 2019-06-19 | Disposition: A | Discharge status: Home / Self Care | Payer: Medicare HMO | Source: Ambulatory Visit | Attending: Internal Medicine | Admitting: Internal Medicine

## 2019-06-19 DIAGNOSIS — I10 Essential (primary) hypertension: Secondary | ICD-10-CM | POA: Diagnosis not present

## 2019-06-20 DIAGNOSIS — G4733 Obstructive sleep apnea (adult) (pediatric): Secondary | ICD-10-CM | POA: Diagnosis not present

## 2019-06-29 DIAGNOSIS — M79641 Pain in right hand: Secondary | ICD-10-CM | POA: Diagnosis not present

## 2019-06-29 DIAGNOSIS — M79642 Pain in left hand: Secondary | ICD-10-CM | POA: Diagnosis not present

## 2019-06-29 DIAGNOSIS — M180 Bilateral primary osteoarthritis of first carpometacarpal joints: Secondary | ICD-10-CM | POA: Diagnosis not present

## 2019-07-02 DIAGNOSIS — M180 Bilateral primary osteoarthritis of first carpometacarpal joints: Secondary | ICD-10-CM | POA: Diagnosis not present

## 2019-08-08 DIAGNOSIS — D225 Melanocytic nevi of trunk: Secondary | ICD-10-CM | POA: Diagnosis not present

## 2019-08-08 DIAGNOSIS — D1801 Hemangioma of skin and subcutaneous tissue: Secondary | ICD-10-CM | POA: Diagnosis not present

## 2019-08-08 DIAGNOSIS — Z85820 Personal history of malignant melanoma of skin: Secondary | ICD-10-CM | POA: Diagnosis not present

## 2019-08-08 DIAGNOSIS — L821 Other seborrheic keratosis: Secondary | ICD-10-CM | POA: Diagnosis not present

## 2019-08-08 DIAGNOSIS — Z85828 Personal history of other malignant neoplasm of skin: Secondary | ICD-10-CM | POA: Diagnosis not present

## 2019-08-08 DIAGNOSIS — L570 Actinic keratosis: Secondary | ICD-10-CM | POA: Diagnosis not present

## 2019-08-27 DIAGNOSIS — M180 Bilateral primary osteoarthritis of first carpometacarpal joints: Secondary | ICD-10-CM | POA: Diagnosis not present

## 2019-09-27 DIAGNOSIS — R252 Cramp and spasm: Secondary | ICD-10-CM | POA: Diagnosis not present

## 2019-09-27 DIAGNOSIS — Z7689 Persons encountering health services in other specified circumstances: Secondary | ICD-10-CM | POA: Diagnosis not present

## 2019-09-27 DIAGNOSIS — I340 Nonrheumatic mitral (valve) insufficiency: Secondary | ICD-10-CM | POA: Diagnosis not present

## 2019-09-27 DIAGNOSIS — I10 Essential (primary) hypertension: Secondary | ICD-10-CM | POA: Diagnosis not present

## 2019-09-27 DIAGNOSIS — R2681 Unsteadiness on feet: Secondary | ICD-10-CM | POA: Diagnosis not present

## 2019-09-27 DIAGNOSIS — I350 Nonrheumatic aortic (valve) stenosis: Secondary | ICD-10-CM | POA: Diagnosis not present

## 2019-11-02 ENCOUNTER — Other Ambulatory Visit: Payer: Self-pay

## 2019-11-02 ENCOUNTER — Encounter: Payer: Self-pay | Admitting: Pulmonary Disease

## 2019-11-02 ENCOUNTER — Ambulatory Visit: Payer: Medicare HMO | Admitting: Pulmonary Disease

## 2019-11-02 VITALS — BP 122/66 | HR 72 | Temp 98.1°F | Ht 70.0 in | Wt 227.0 lb

## 2019-11-02 DIAGNOSIS — G4733 Obstructive sleep apnea (adult) (pediatric): Secondary | ICD-10-CM

## 2019-11-02 NOTE — Progress Notes (Signed)
@Patient  ID: Ricardo [name], male    DOB: Dec 15, 1940, 79 y.o.   MRN: [id number]  Chief Complaint  Patient presents with  . Follow-up    pt is here for cpap compliance    Referring provider: Leanna Battles, MD  HPI:  79 year old male former smoker followed in our office for moderate obstructive sleep apnea, managed on CPAP.  Previously tried on oral appliance but did not tolerate as it altered his bite.  PMH: Allergic rhinitis, cough Smoker/ Smoking History: Former smoker Maintenance: None Pt of: Dr. Lamonte Sakai  11/02/2019  - Visit   79 year old male former smoker presenting to office today as a CPAP follow-up.  He is completed 1 year follow-up with our office today.  He has no acute respiratory complaints.  He reports he is doing well on CPAP therapy.  CPAP compliance report listed below:  10/02/2019-10/31/2019-30 had a last 30 days use, all 30 those days greater than 4 hours, average usage 8 hours and 19 minutes, CPAP set pressure of 9, AHI 1.7  Patient has occasional dry mouth when using CPAP therapy.  He is using a nasal mask.  He has not yet tried to adjust his humidity.  We will discuss this today.  Questionaires / Pulmonary Flowsheets:   ACT:  No flowsheet data found.  MMRC: No flowsheet data found.  Epworth:  No flowsheet data found.  Tests:   03/19/2015-home sleep study- AHI 19.7-hour, SaO2 low 80%   FENO:  No results found for: NITRICOXIDE  PFT: No flowsheet data found.  WALK:  No flowsheet data found.  Imaging: No results found.  Lab Results:  CBC No results found for: WBC, RBC, HGB, HCT, PLT, MCV, MCH, MCHC, RDW, LYMPHSABS, MONOABS, EOSABS, BASOSABS  BMET No results found for: NA, K, CL, CO2, GLUCOSE, BUN, CREATININE, CALCIUM, GFRNONAA, GFRAA  BNP No results found for: BNP  ProBNP No results found for: PROBNP  Specialty Problems      Pulmonary Problems   Allergic rhinitis    Qualifier: Diagnosis of  By: [name] RN, [name]         Obstructive sleep apnea    03/19/2015-home sleep study- AHI 19.7-hour, SaO2 low 80%        Cough      No Known Allergies  Immunization History  Administered Date(s) Administered  . Influenza, High Dose Seasonal PF 03/15/2016  . Influenza,inj,Quad PF,6+ Mos 12/04/2014  . Influenza-Unspecified 12/04/2017, 11/02/2019  . PFIZER SARS-COV-2 Vaccination 04/20/2019, 05/21/2019    Past Medical History:  Diagnosis Date  . Chest pain   . Depression   . Hemorrhoids   . History of pneumonia   . Hx of colonic polyps 10/31/2002   Hyperplastic   . Obstructive sleep apnea   . Peptic ulcer   . Syncope   . Tachycardia     Tobacco History: Social History   Tobacco Use  Smoking Status Former Smoker  . Types: Pipe  . Quit date: 04/05/1988  . Years since quitting: 31.5  Smokeless Tobacco Never Used   Counseling given: Not Answered   Continue to not smoke  Outpatient Encounter Medications as of 11/02/2019  Medication Sig  . Ascorbic Acid (VITAMIN C) 500 MG CAPS Take 500 mg by mouth. With zinc  . aspirin 81 MG tablet Take 81 mg by mouth daily. Reported on 07/07/2015  . Cholecalciferol (VITAMIN D3) 50000 units TABS Take 1 tablet by mouth daily.  Marland Kitchen escitalopram (LEXAPRO) 10 MG tablet Take 10 mg by mouth daily.  Marland Kitchen  irbesartan-hydrochlorothiazide (AVALIDE) 150-12.5 MG tablet Take 1 tablet by mouth daily. Pt is taking 11/2 tab daily  . magnesium gluconate (MAGONATE) 500 MG tablet Take 500 mg by mouth daily.  . metFORMIN (GLUCOPHAGE) 500 MG tablet Take by mouth daily.  . Multiple Vitamin (MULTIVITAMIN) tablet Take 1 tablet by mouth daily.  . Naproxen Sodium (ALEVE) 220 MG CAPS Take by mouth.   No facility-administered encounter medications on file as of 11/02/2019.     Review of Systems  Review of Systems  Constitutional: Negative for activity change, chills, fatigue, fever and unexpected weight change.  HENT: Negative for postnasal drip, rhinorrhea, sinus pressure, sinus pain and sore  throat.   Eyes: Negative.   Respiratory: Negative for cough, shortness of breath and wheezing.   Cardiovascular: Negative for chest pain and palpitations.  Gastrointestinal: Negative for constipation, diarrhea, nausea and vomiting.  Endocrine: Negative.   Genitourinary: Negative.   Musculoskeletal: Negative.   Skin: Negative.   Neurological: Negative for dizziness and headaches.  Psychiatric/Behavioral: Negative.  Negative for dysphoric mood. The patient is not nervous/anxious.   All other systems reviewed and are negative.    Physical Exam  BP 122/66 (BP Location: Left Arm, Cuff Size: Normal)   Pulse 72   Temp 98.1 F (36.7 C) (Oral)   Ht 5\' 10"  (1.778 m)   Wt (!) 227 lb (103 kg)   SpO2 97%   BMI 32.57 kg/m   Wt Readings from Last 5 Encounters:  11/02/19 (!) 227 lb (103 kg)  11/02/18 229 lb (103.9 kg)  07/13/17 223 lb (101.2 kg)  07/13/16 215 lb 12.8 oz (97.9 kg)  07/07/15 223 lb (101.2 kg)    BMI Readings from Last 5 Encounters:  11/02/19 32.57 kg/m  11/02/18 32.86 kg/m  07/13/17 31.10 kg/m  07/13/16 30.96 kg/m  07/07/15 32.00 kg/m     Physical Exam Vitals and nursing note reviewed.  Constitutional:      General: He is not in acute distress.    Appearance: Normal appearance. He is normal weight.  HENT:     Head: Normocephalic and atraumatic.     Right Ear: Hearing and external ear normal.     Left Ear: Hearing and external ear normal.     Nose: No mucosal edema.     Right Turbinates: Not enlarged.     Left Turbinates: Not enlarged.     Mouth/Throat:     Mouth: Mucous membranes are dry.     Pharynx: Oropharynx is clear. No oropharyngeal exudate.  Eyes:     Pupils: Pupils are equal, round, and reactive to light.  Cardiovascular:     Rate and Rhythm: Normal rate and regular rhythm.     Pulses: Normal pulses.     Heart sounds: Normal heart sounds. No murmur heard.   Pulmonary:     Effort: Pulmonary effort is normal.     Breath sounds: Normal  breath sounds. No decreased breath sounds, wheezing or rales.  Musculoskeletal:     Cervical back: Normal range of motion.     Right lower leg: No edema.     Left lower leg: No edema.  Lymphadenopathy:     Cervical: No cervical adenopathy.  Skin:    General: Skin is warm and dry.     Capillary Refill: Capillary refill takes less than 2 seconds.     Findings: No erythema or rash.  Neurological:     General: No focal deficit present.     Mental Status: He is alert  and oriented to person, place, and time.     Motor: No weakness.     Coordination: Coordination normal.     Gait: Gait is intact. Gait normal.  Psychiatric:        Mood and Affect: Mood normal.        Behavior: Behavior normal. Behavior is cooperative.        Thought Content: Thought content normal.        Judgment: Judgment normal.       Assessment & Plan:   Obstructive sleep apnea Plan: Continue CPAP therapy We will send a message to DME company to see if they can follow-up with the patient on adjusting humidity settings Follow-up in 1 year    Return in about 1 year (around 11/01/2020), or if symptoms worsen or fail to improve, for Follow up with Dr. Lamonte Sakai.   Lauraine Rinne, NP 11/02/2019   This appointment required 23 minutes of patient care (this includes precharting, chart review, review of results, face-to-face care, etc.).

## 2019-11-02 NOTE — Patient Instructions (Addendum)
You were seen today by Lauraine Rinne, NP  for:   1. Obstructive sleep apnea  We will send an order over to your DME company to see if they can adjust her humidity settings we will review this with you  We recommend that you continue using your CPAP daily >>>Keep up the hard work using your device >>> Goal should be wearing this for the entire night that you are sleeping, at least 4 to 6 hours  Remember:  . Do not drive or operate heavy machinery if tired or drowsy.  . Please notify the supply company and office if you are unable to use your device regularly due to missing supplies or machine being broken.  . Work on maintaining a healthy weight and following your recommended nutrition plan  . Maintain proper daily exercise and movement  . Maintaining proper use of your device can also help improve management of other chronic illnesses such as: Blood pressure, blood sugars, and weight management.   BiPAP/ CPAP Cleaning:  >>>Clean weekly, with Dawn soap, and bottle brush.  Set up to air dry. >>> Wipe mask out daily with wet wipe or towelette    Follow Up:    Return in about 1 year (around 11/01/2020), or if symptoms worsen or fail to improve, for Follow up with Dr. Lamonte Sakai.   Please do your part to reduce the spread of COVID-19:      Reduce your risk of any infection  and COVID19 by using the similar precautions used for avoiding the common cold or flu:  Marland Kitchen Wash your hands often with soap and warm water for at least 20 seconds.  If soap and water are not readily available, use an alcohol-based hand sanitizer with at least 60% alcohol.  . If coughing or sneezing, cover your mouth and nose by coughing or sneezing into the elbow areas of your shirt or coat, into a tissue or into your sleeve (not your hands). Langley Gauss A MASK when in public  . Avoid shaking hands with others and consider head nods or verbal greetings only. . Avoid touching your eyes, nose, or mouth with unwashed hands.   . Avoid close contact with people who are sick. . Avoid places or events with large numbers of people in one location, like concerts or sporting events. . If you have some symptoms but not all symptoms, continue to monitor at home and seek medical attention if your symptoms worsen. . If you are having a medical emergency, call 911.   [place] / e-Visit: [email or web address]         [facility]: [facility]: [phone]                   [facility]: [phone]     It is flu season:   >>> Best ways to protect herself from the flu: Receive the yearly flu vaccine, practice good hand hygiene washing with soap and also using hand sanitizer when available, eat a nutritious meals, get adequate rest, hydrate appropriately   Please contact the office if your symptoms worsen or you have concerns that you are not improving.   Thank you for choosing [facility] for your healthcare, and for allowing [place] to partner with you on your healthcare journey. I am thankful to be able to provide care to you today.   Wyn Quaker FNP-C

## 2019-11-02 NOTE — Assessment & Plan Note (Signed)
Plan: Continue CPAP therapy We will send a message to DME company to see if they can follow-up with the patient on adjusting humidity settings Follow-up in 1 year

## 2019-11-05 NOTE — Progress Notes (Signed)
Cardiology Office Note:    Date:  11/06/2019   ID:  Ricardo [name], DOB 12-May-1940, MRN [id number]  PCP:  Leanna Battles, MD  [facility] Cardiologist:  Meshulem Onorato  [facility] Electrophysiologist:  None   Referring MD: Leanna Battles, MD   Chief Complaint  Patient presents with   Shortness of Breath    History of Present Illness:    Ricardo [name] is a 79 y.o. male with a hx of dyspnea..   I last saw him around 2010. We were asked to see him again by [name] for further evaluation of progressive dyspena  He has been having more shortness of breath and fatigue with exertion .  No CP or dyspnea Getting some regular exercise,  Issues - balance is off - some of this issue sounds like orthostatic hypotension .  Most of his balance issues occur after he has been standing .   Difficult for him to walk heel-to-toe  -  Has leg cramps at night .  Relieved with stretching his legs     Past Medical History:  Diagnosis Date   Allergic rhinitis 05/24/2007   Qualifier: Diagnosis of  By: [name] RN, [name]     Chest pain    Cough 07/13/2016   Depression    Falls frequently    Hemorrhoids    History of pneumonia    Hx of colonic polyps 10/31/2002   Hyperplastic    Leg cramps    Numbness and tingling in both hands    Obstructive sleep apnea    Peptic ulcer    Syncope    Tachycardia     Past Surgical History:  Procedure Laterality Date   APPENDECTOMY  1957    Current Medications: Current Meds  Medication Sig   Ascorbic Acid (VITAMIN C) 500 MG CAPS Take 500 mg by mouth. With zinc   aspirin 81 MG tablet Take 81 mg by mouth daily. Reported on 07/07/2015   Cholecalciferol (VITAMIN D3) 50000 units TABS Take 1 tablet by mouth daily.   escitalopram (LEXAPRO) 10 MG tablet Take 10 mg by mouth daily.   magnesium gluconate (MAGONATE) 500 MG tablet Take 500 mg by mouth daily.   metFORMIN (GLUCOPHAGE) 500 MG tablet Take by mouth daily.   Multiple Vitamin  (MULTIVITAMIN) tablet Take 1 tablet by mouth daily.   Naproxen Sodium (ALEVE) 220 MG CAPS Take by mouth.   [DISCONTINUED] irbesartan-hydrochlorothiazide (AVALIDE) 150-12.5 MG tablet Take 1 tablet by mouth daily. Pt is taking 11/2 tab daily     Allergies:   Patient has no known allergies.   Social History   Socioeconomic History   Marital status: Married    Spouse name: Not on file   Number of children: 3   Years of education: Not on file   Highest education level: Not on file  Occupational History   Occupation: Chief Financial Officer    Employer: [employer]  Tobacco Use   Smoking status: Former Smoker    Types: Pipe    Quit date: 04/05/1988    Years since quitting: 31.6   Smokeless tobacco: Never Used  Substance and Sexual Activity   Alcohol use: Yes    Comment: occasionally   Drug use: No   Sexual activity: Not on file  Other Topics Concern   Not on file  Social History Narrative   Not on file   Social Determinants of Health   Financial Resource Strain:    Difficulty of Paying Living Expenses:   Food Insecurity:  Worried About Charity fundraiser in the Last Year:    Arboriculturist in the Last Year:   Transportation Needs:    Film/video editor (Medical):    Lack of Transportation (Non-Medical):   Physical Activity:    Days of Exercise per Week:    Minutes of Exercise per Session:   Stress:    Feeling of Stress :   Social Connections:    Frequency of Communication with Friends and Family:    Frequency of Social Gatherings with Friends and Family:    Attends Religious Services:    Active Member of Clubs or Organizations:    Attends Music therapist:    Marital Status:      Family History: The patient's family history includes Anuerysm in his brother and father; Hypertension in his mother; Pancreatic cancer in his mother; Stroke in his father.  ROS:   Please see the history of present illness.     All other systems  reviewed and are negative.  EKGs/Labs/Other Studies Reviewed:    The following studies were reviewed today:   EKG:    Recent Labs: No results found for requested labs within last 8760 hours.  Recent Lipid Panel No results found for: CHOL, TRIG, HDL, CHOLHDL, VLDL, LDLCALC, LDLDIRECT  Physical Exam:    VS:  BP 110/70    Pulse (!) 59    Ht 5\' 10"  (1.778 m)    Wt 230 lb (104.3 kg)    SpO2 97%    BMI 33.00 kg/m     Wt Readings from Last 3 Encounters:  11/06/19 230 lb (104.3 kg)  11/02/19 (!) 227 lb (103 kg)  11/02/18 229 lb (103.9 kg)     GEN: middle age man,  NAD  HEENT: Normal NECK: No JVD; No carotid bruits LYMPHATICS: No lymphadenopathy CARDIAC: RRR, no murmurs, rubs, gallops RESPIRATORY:  Clear to auscultation without rales, wheezing or rhonchi  ABDOMEN: Soft, non-tender, non-distended MUSCULOSKELETAL:  No edema; No deformity  SKIN: Warm and dry NEUROLOGIC:  Alert and oriented x 3 PSYCHIATRIC:  Normal affect   ECG: November 06, 2019: Sinus bradycardia 59 beats minute.  First-degree AV block.  Diagnoses:    1. Obstructive sleep apnea    Assessment and Plan:    1.  Orthostatic hypotension: [name] presents with some episodes of dizziness.  These occur especially when he is going from a squatting to a standing position.  He also has trouble with his balance when he is walking for example it is very difficult for him to do heel-to-toe walking.  He also complains of having some leg cramps at night but his potassium and magnesium levels are normal.  There is a possibility that both of these are coming from his diuretic therapy.  His orthostatic hypotension might be made worse with the HCTZ and also the HCTZ might be causing dehydration and the leg cramps.  We will discontinue the irbesartan HCTZ and start him on irbesartan 150 mg a day.  If this does not solve the problem with his unsteadiness and balance issues have asked him to contact Dr. [name] again for further advice.  He  may need to go to vestibular physical therapy or have a neuro work-up.   Medication Adjustments/Labs and Tests Ordered: Current medicines are reviewed at length with the patient today.  Concerns regarding medicines are outlined above.  Orders Placed This Encounter  Procedures   EKG 12-Lead   Meds ordered this encounter  Medications  DISCONTD: irbesartan (AVAPRO) 150 MG tablet    Sig: Take 1 tablet (150 mg total) by mouth daily.    Dispense:  90 tablet    Refill:  3   irbesartan (AVAPRO) 150 MG tablet    Sig: Take 1 tablet (150 mg total) by mouth daily.    Dispense:  90 tablet    Refill:  3    Patient Instructions  Medication Instructions:  Your physician has recommended you make the following change in your medication:  1) STOP taking irbesartan-hctz (Avalide) 2) START taking irbesartan (Avapro) 150 mg daily  *If you need a refill on your cardiac medications before your next appointment, please call your pharmacy*  Follow-Up: At [facility], you and your health needs are our priority.  As part of our continuing mission to provide you with exceptional heart care, we have created designated Provider Care Teams.  These Care Teams include your primary Cardiologist (physician) and Advanced Practice Providers (APPs -  Physician Assistants and Nurse Practitioners) who all work together to provide you with the care you need, when you need it.  We recommend signing up for the patient portal called "MyChart".  Sign up information is provided on this After Visit Summary.  MyChart is used to connect with patients for Virtual Visits (Telemedicine).  Patients are able to view lab/test results, encounter notes, upcoming appointments, etc.  Non-urgent messages can be sent to your provider as well.   To learn more about what you can do with MyChart, go to [email or web address].    Your next appointment:   1 year(s)  The format for your next appointment:   In Person  Provider:   You  may see Mertie Moores, MD or one of the following Advanced Practice Providers on your designated Care Team:    Richardson Dopp, PA-C  Robbie Lis, [place]      Signed, Mertie Moores, MD  11/06/2019 2:50 PM    [place]

## 2019-11-06 ENCOUNTER — Other Ambulatory Visit: Payer: Self-pay

## 2019-11-06 ENCOUNTER — Encounter: Payer: Self-pay | Admitting: Cardiovascular Disease

## 2019-11-06 ENCOUNTER — Ambulatory Visit: Payer: Medicare HMO | Admitting: Cardiovascular Disease

## 2019-11-06 VITALS — BP 110/70 | HR 59 | Ht 70.0 in | Wt 230.0 lb

## 2019-11-06 DIAGNOSIS — I10 Essential (primary) hypertension: Secondary | ICD-10-CM | POA: Diagnosis not present

## 2019-11-06 DIAGNOSIS — G4733 Obstructive sleep apnea (adult) (pediatric): Secondary | ICD-10-CM | POA: Diagnosis not present

## 2019-11-06 MED ORDER — IRBESARTAN 150 MG PO TABS: 150.0000 mg | ORAL_TABLET | Freq: Every day | ORAL | 3 refills | Status: DC

## 2019-11-06 NOTE — Patient Instructions (Addendum)
Medication Instructions:  Your physician has recommended you make the following change in your medication:  1) STOP taking irbesartan-hctz (Avalide) 2) START taking irbesartan (Avapro) 150 mg daily  *If you need a refill on your cardiac medications before your next appointment, please call your pharmacy*  Follow-Up: At [facility], you and your health needs are our priority.  As part of our continuing mission to provide you with exceptional heart care, we have created designated Provider Care Teams.  These Care Teams include your primary Cardiologist (physician) and Advanced Practice Providers (APPs -  Physician Assistants and Nurse Practitioners) who all work together to provide you with the care you need, when you need it.  We recommend signing up for the patient portal called "MyChart".  Sign up information is provided on this After Visit Summary.  MyChart is used to connect with patients for Virtual Visits (Telemedicine).  Patients are able to view lab/test results, encounter notes, upcoming appointments, etc.  Non-urgent messages can be sent to your provider as well.   To learn more about what you can do with MyChart, go to [email or web address].    Your next appointment:   1 year(s)  The format for your next appointment:   In Person  Provider:   You may see Mertie Moores, MD or one of the following Advanced Practice Providers on your designated Care Team:    Richardson Dopp, PA-C  [place], [place]

## 2020-01-11 DIAGNOSIS — R739 Hyperglycemia, unspecified: Secondary | ICD-10-CM | POA: Diagnosis not present

## 2020-01-11 DIAGNOSIS — Z125 Encounter for screening for malignant neoplasm of prostate: Secondary | ICD-10-CM | POA: Diagnosis not present

## 2020-01-11 DIAGNOSIS — E785 Hyperlipidemia, unspecified: Secondary | ICD-10-CM | POA: Diagnosis not present

## 2020-01-18 DIAGNOSIS — G4733 Obstructive sleep apnea (adult) (pediatric): Secondary | ICD-10-CM | POA: Diagnosis not present

## 2020-01-18 DIAGNOSIS — M542 Cervicalgia: Secondary | ICD-10-CM | POA: Diagnosis not present

## 2020-01-18 DIAGNOSIS — Z0000 Encounter for general adult medical examination without abnormal findings: Secondary | ICD-10-CM | POA: Diagnosis not present

## 2020-01-18 DIAGNOSIS — I10 Essential (primary) hypertension: Secondary | ICD-10-CM | POA: Diagnosis not present

## 2020-01-18 DIAGNOSIS — I340 Nonrheumatic mitral (valve) insufficiency: Secondary | ICD-10-CM | POA: Diagnosis not present

## 2020-01-18 DIAGNOSIS — R82998 Other abnormal findings in urine: Secondary | ICD-10-CM | POA: Diagnosis not present

## 2020-01-18 DIAGNOSIS — I350 Nonrheumatic aortic (valve) stenosis: Secondary | ICD-10-CM | POA: Diagnosis not present

## 2020-01-18 DIAGNOSIS — E1151 Type 2 diabetes mellitus with diabetic peripheral angiopathy without gangrene: Secondary | ICD-10-CM | POA: Diagnosis not present

## 2020-01-18 DIAGNOSIS — E669 Obesity, unspecified: Secondary | ICD-10-CM | POA: Diagnosis not present

## 2020-01-18 DIAGNOSIS — I872 Venous insufficiency (chronic) (peripheral): Secondary | ICD-10-CM | POA: Diagnosis not present

## 2020-01-18 DIAGNOSIS — R0982 Postnasal drip: Secondary | ICD-10-CM | POA: Diagnosis not present

## 2020-02-02 DIAGNOSIS — Z23 Encounter for immunization: Secondary | ICD-10-CM | POA: Diagnosis not present

## 2020-02-26 DIAGNOSIS — Z1212 Encounter for screening for malignant neoplasm of rectum: Secondary | ICD-10-CM | POA: Diagnosis not present

## 2020-03-12 DIAGNOSIS — Z6831 Body mass index (BMI) 31.0-31.9, adult: Secondary | ICD-10-CM | POA: Diagnosis not present

## 2020-03-12 DIAGNOSIS — I10 Essential (primary) hypertension: Secondary | ICD-10-CM | POA: Diagnosis not present

## 2020-03-12 DIAGNOSIS — M5416 Radiculopathy, lumbar region: Secondary | ICD-10-CM | POA: Diagnosis not present

## 2020-03-13 ENCOUNTER — Telehealth: Payer: Self-pay | Admitting: *Deleted

## 2020-03-13 NOTE — Telephone Encounter (Signed)
   Primary Cardiologist: Dr. Acie Fredrickson Chart reviewed as part of pre-operative protocol coverage. Patient was last seen by Dr. Acie Fredrickson in 11/2019. Patient was contacted today for further pre-op evaluation and reported doing well since last visit. He denies any chest pain, shortness of breath, orthopnea, PND, edema, palpitations, lightheadedness, dizziness, syncope. He is able to complete >4.0 METS without any problems. Given past medical history and time since last visit, based on ACC/AHA guidelines, Ricardo [name] would be at acceptable risk for the planned procedure without further cardiovascular testing.   The patient was advised that if he develops new symptoms prior to surgery to contact our office to arrange for a follow-up visit, and he verbalized understanding.  Patient is on Aspirin for primary prevention. He has no history of stroke, MI, or PCI. Therefore, OK to hold Aspirin as needed prior to surgery. Would restart when able postoperatively.   I will route this recommendation to the requesting party via Epic fax function and remove from pre-op pool.  Please call with questions.  Darreld Mclean, PA-C 03/13/2020, 1:37 PM

## 2020-03-13 NOTE — Telephone Encounter (Signed)
   [facility] Pre-operative Risk Assessment    HEARTCARE STAFF: - Please ensure there is not already an duplicate clearance open for this procedure. - Under Visit Info/Reason for Call, type in Other and utilize the format Clearance MM/DD/YY or Clearance TBD. Do not use dashes or single digits. - If request is for dental extraction, please clarify the # of teeth to be extracted.  Request for surgical clearance:  1. What type of surgery is being performed? L4-5 POSTERIOR LUMBAR INTERBODY FUSION   2. When is this surgery scheduled? TBD   3. What type of clearance is required (medical clearance vs. Pharmacy clearance to hold med vs. Both)? MEDICAL  4. Are there any medications that need to be held prior to surgery and how long? ASA   5. Practice name and name of physician performing surgery? [facility]; DR. Kristeen Miss   6. What is the office phone number? [phone]   7.   What is the office fax number? [place]: JESSICA  8.   Anesthesia type (None, local, MAC, general) ? GENERAL   Julaine Hua 03/13/2020, 1:25 PM  _________________________________________________________________   (provider comments below)

## 2020-03-18 ENCOUNTER — Other Ambulatory Visit: Payer: Self-pay | Admitting: Neurological Surgery

## 2020-04-18 ENCOUNTER — Other Ambulatory Visit (HOSPITAL_COMMUNITY): Payer: Medicare HMO

## 2020-04-18 ENCOUNTER — Inpatient Hospital Stay (HOSPITAL_COMMUNITY): Admission: RE | Admit: 2020-04-18 | Payer: Medicare HMO | Source: Ambulatory Visit

## 2020-05-12 NOTE — Progress Notes (Signed)
Surgical Instructions    Your procedure is scheduled on February 10  Report to [facility] Main Entrance "A" at 0530 A.M., then check in with the Admitting office.  Call this number if you have problems the morning of surgery:  [phone]   If you have any questions prior to your surgery date call [phone]: Open Monday-Friday 8am-4pm    Remember:  Do not eat or drink after midnight the night before your surgery    Take these medicines the morning of surgery with A SIP OF WATER  escitalopram (LEXAPRO  Follow your surgeon's instructions on when to stop Aspirin.  If no instructions were given by your surgeon then you will need to call the office to get those instructions.    As of today, STOP taking any Aspirin (unless otherwise instructed by your surgeon) Aleve, Naproxen, Ibuprofen, Motrin, Advil, Goody's, BC's, all herbal medications, fish oil, and all vitamins.  WHAT DO I DO ABOUT MY DIABETES MEDICATION?   Marland Kitchen Do not take oral diabetes medicines (pills) the morning of surgery. metFORMIN (GLUCOPHAGE-XR)  HOW TO MANAGE YOUR DIABETES BEFORE AND AFTER SURGERY  Why is it important to control my blood sugar before and after surgery? . Improving blood sugar levels before and after surgery helps healing and can limit problems. . A way of improving blood sugar control is eating a healthy diet by: o  Eating less sugar and carbohydrates o  Increasing activity/exercise o  Talking with your doctor about reaching your blood sugar goals . High blood sugars (greater than 180 mg/dL) can raise your risk of infections and slow your recovery, so you will need to focus on controlling your diabetes during the weeks before surgery. . Make sure that the doctor who takes care of your diabetes knows about your planned surgery including the date and location.  How do I manage my blood sugar before surgery? . Check your blood sugar at least 4 times a day, starting 2 days before surgery, to make sure  that the level is not too high or low. . Check your blood sugar the morning of your surgery when you wake up and every 2 hours until you get to the Short Stay unit. o If your blood sugar is less than 70 mg/dL, you will need to treat for low blood sugar: - Do not take insulin. - Treat a low blood sugar (less than 70 mg/dL) with  cup of clear juice (cranberry or apple), 4 glucose tablets, OR glucose gel. - Recheck blood sugar in 15 minutes after treatment (to make sure it is greater than 70 mg/dL). If your blood sugar is not greater than 70 mg/dL on recheck, call [phone] for further instructions. . Report your blood sugar to the short stay nurse when you get to Short Stay.  . If you are admitted to the hospital after surgery: o Your blood sugar will be checked by the staff and you will probably be given insulin after surgery (instead of oral diabetes medicines) to make sure you have good blood sugar levels. o The goal for blood sugar control after surgery is 80-180 mg/dL.                     Do not wear jewelry            Do not wear lotions, powders, colognes, or deodorant.             Men may shave face and neck.  Do not bring valuables to the hospital.            [facility] is not responsible for any belongings or valuables.  Do NOT Smoke (Tobacco/Vaping) or drink Alcohol 24 hours prior to your procedure If you use a CPAP at night, you may bring all equipment for your overnight stay.   Contacts, glasses, dentures or bridgework may not be worn into surgery, please bring cases for these belongings   For patients admitted to the hospital, discharge time will be determined by your treatment team.   Patients discharged the day of surgery will not be allowed to drive home, and someone needs to stay with them for 24 hours.    Special instructions:   [place]- Preparing For Surgery  Before surgery, you can play an important role. Because skin is not sterile, your skin  needs to be as free of germs as possible. You can reduce the number of germs on your skin by washing with CHG (chlorahexidine gluconate) Soap before surgery.  CHG is an antiseptic cleaner which kills germs and bonds with the skin to continue killing germs even after washing.    Oral Hygiene is also important to reduce your risk of infection.  Remember - BRUSH YOUR TEETH THE MORNING OF SURGERY WITH YOUR REGULAR TOOTHPASTE  Please do not use if you have an allergy to CHG or antibacterial soaps. If your skin becomes reddened/irritated stop using the CHG.  Do not shave (including legs and underarms) for at least 48 hours prior to first CHG shower. It is OK to shave your face.  Please follow these instructions carefully.   1. Shower the NIGHT BEFORE SURGERY and the MORNING OF SURGERY  2. If you chose to wash your hair, wash your hair first as usual with your normal shampoo.  3. After you shampoo, rinse your hair and body thoroughly to remove the shampoo.  4. Wash Face and genitals (private parts) with your normal soap.   5.  Shower the NIGHT BEFORE SURGERY and the MORNING OF SURGERY with CHG Soap.   6. Use CHG Soap as you would any other liquid soap. You can apply CHG directly to the skin and wash gently with a scrungie or a clean washcloth.   7. Apply the CHG Soap to your body ONLY FROM THE NECK DOWN.  Do not use on open wounds or open sores. Avoid contact with your eyes, ears, mouth and genitals (private parts). Wash Face and genitals (private parts)  with your normal soap.   8. Wash thoroughly, paying special attention to the area where your surgery will be performed.  9. Thoroughly rinse your body with warm water from the neck down.  10. DO NOT shower/wash with your normal soap after using and rinsing off the CHG Soap.  11. Pat yourself dry with a CLEAN TOWEL.  12. Wear CLEAN PAJAMAS to bed the night before surgery  13. Place CLEAN SHEETS on your bed the night before your  surgery  14. DO NOT SLEEP WITH PETS.   Day of Surgery: Wear Clean/Comfortable clothing the morning of surgery Do not apply any deodorants/lotions.   Remember to brush your teeth WITH YOUR REGULAR TOOTHPASTE.   Please read over the following fact sheets that you were given.

## 2020-05-13 ENCOUNTER — Encounter (HOSPITAL_COMMUNITY): Payer: Self-pay

## 2020-05-13 ENCOUNTER — Other Ambulatory Visit (HOSPITAL_COMMUNITY)
Admission: RE | Admit: 2020-05-13 | Discharge: 2020-05-13 | Disposition: A | Discharge status: Home / Self Care | Payer: Medicare HMO | Source: Ambulatory Visit | Attending: Neurological Surgery | Admitting: Neurological Surgery

## 2020-05-13 ENCOUNTER — Other Ambulatory Visit: Payer: Self-pay

## 2020-05-13 ENCOUNTER — Encounter (HOSPITAL_COMMUNITY)
Admission: RE | Admit: 2020-05-13 | Discharge: 2020-05-13 | Disposition: A | Discharge status: Home / Self Care | Payer: Medicare HMO | Source: Ambulatory Visit | Attending: Neurological Surgery | Admitting: Neurological Surgery

## 2020-05-13 DIAGNOSIS — Z01812 Encounter for preprocedural laboratory examination: Secondary | ICD-10-CM | POA: Insufficient documentation

## 2020-05-13 DIAGNOSIS — Z20822 Contact with and (suspected) exposure to covid-19: Secondary | ICD-10-CM | POA: Insufficient documentation

## 2020-05-13 HISTORY — DX: Dyspnea, unspecified: R06.00

## 2020-05-13 HISTORY — DX: Type 2 diabetes mellitus without complications: E11.9

## 2020-05-13 HISTORY — DX: Essential (primary) hypertension: I10

## 2020-05-13 HISTORY — DX: Cardiac arrhythmia, unspecified: I49.9

## 2020-05-13 LAB — BASIC METABOLIC PANEL
Anion gap: 10 (ref 5–15)
BUN: 18 mg/dL (ref 8–23)
CO2: 25 mmol/L (ref 22–32)
Calcium: 9.5 mg/dL (ref 8.9–10.3)
Chloride: 106 mmol/L (ref 98–111)
Creatinine, Ser: 0.98 mg/dL (ref 0.61–1.24)
GFR, Estimated: >60 mL/min (ref >60)
Glucose, Bld: 140 mg/dL — ABNORMAL HIGH (ref 70–99)
Potassium: 4.2 mmol/L (ref 3.5–5.1)
Sodium: 141 mmol/L (ref 135–145)

## 2020-05-13 LAB — CBC
HCT: 44.7 % (ref 39.0–52.0)
Hemoglobin: 14.5 g/dL (ref 13.0–17.0)
MCH: 29.1 pg (ref 26.0–34.0)
MCHC: 32.4 g/dL (ref 30.0–36.0)
MCV: 89.8 fL (ref 80.0–100.0)
Platelets: 204 10*3/uL (ref 150–400)
RBC: 4.98 MIL/uL (ref 4.22–5.81)
RDW: 13 % (ref 11.5–15.5)
WBC: 6.4 10*3/uL (ref 4.0–10.5)
nRBC: 0 % (ref 0.0–0.2)

## 2020-05-13 LAB — TYPE AND SCREEN
ABO/RH(D): A POS
Antibody Screen: NEGATIVE

## 2020-05-13 LAB — SURGICAL PCR SCREEN
MRSA, PCR: NEGATIVE
Staphylococcus aureus: NEGATIVE

## 2020-05-13 LAB — SARS CORONAVIRUS 2 (TAT 6-24 HRS): SARS Coronavirus 2: NEGATIVE

## 2020-05-13 LAB — HEMOGLOBIN A1C
Hgb A1c MFr Bld: 6.1 % — ABNORMAL HIGH (ref 4.8–5.6)
Mean Plasma Glucose: 128.37 mg/dL

## 2020-05-13 LAB — GLUCOSE, CAPILLARY: Glucose-Capillary: 141 mg/dL — ABNORMAL HIGH (ref 70–99)

## 2020-05-13 MED ORDER — CHLORHEXIDINE GLUCONATE CLOTH 2 % EX PADS: 6.0000 | MEDICATED_PAD | Freq: Once | CUTANEOUS | Status: DC

## 2020-05-13 NOTE — Progress Notes (Signed)
PCP     DR Sharlett Iles   [facility] Cardiologist - DR Manley Mason     CLEARANCE IN EPIC    -  Chest x-ray - NA EKG - 8/21 Stress Test - 2/09 ECHO - 9/08 Cardiac Cath - NA  Sleep Study - YES CPAP - YES  Fasting Blood Sugar - NOT KNOWN Checks Blood Sugar _0____ times a day   Aspirin Instructions:STOP  COVID TEST- FOR 05/13/20   Anesthesia review: HEART HX  Patient denies shortness of breath, fever, cough and chest pain at PAT appointment   All instructions explained to the patient, with a verbal understanding of the material. Patient agrees to go over the instructions while at home for a better understanding. Patient also instructed to self quarantine after being tested for COVID-19. The opportunity to ask questions was provided.

## 2020-05-14 NOTE — Progress Notes (Signed)
Anesthesia Chart Review:  Patient was remotely evaluated by cardiologist Dr. Acie Fredrickson for chest pain and dyspnea. In 2009 he had stress test which was normal. He had an echocardiogram in 2011 which was also normal. More recently he was seen by Dr. Acie Fredrickson 11/06/2019 for orthostatic hypotension and issues with balance. His HCTZ was discontinued he was advised to follow-up with his primary care if his issues with balance and heel-to-toe walking persisted. He was advised to follow-up with cardiology in 1 year.   Cardiac clearance per telephone encounter 03/13/2020, "Chart reviewed as part of pre-operative protocol coverage. Patient was last seen by Dr. Acie Fredrickson in 11/2019. Patient was contacted today for further pre-op evaluation and reported doing well since last visit. He denies any chest pain, shortness of breath, orthopnea, PND, edema, palpitations, lightheadedness, dizziness, syncope. He is able to complete >4.0 METS without any problems. Given past medical history and time since last visit, based on ACC/AHA guidelines, Ricardo [name] would be at acceptable risk for the planned procedure without further cardiovascular testing. The patient was advised that if he develops new symptoms prior to surgery to contact our office to arrange for a follow-up visit, and he verbalized understanding. Patient is on Aspirin for primary prevention. He has no history of stroke, MI, or PCI. Therefore, OK to hold Aspirin as needed prior to surgery. Would restart when able postoperatively.   OSA on CPAP.  Preop labs reviewed, unremarkable.  EKG 11/06/2019: Sinus bradycardia. Rate 59. First-degree AV block.  TTE 06/03/2009: Impression: Normal LV systolic function. There is mild LVH with impaired LV relaxation. Mild aortic sclerosis/calcification. Trace mitral regurgitation. Trace tricuspid regurgitation. Trivial pulmonic insufficiency.  Rest stress Myoview 05/26/2007: Overall impression: Normal stress nuclear study. No evidence  of ischemia. Normal LV function.   Wynonia Musty [facility] Short Stay Center/Anesthesiology Phone [phone] 05/14/2020 9:44 AM

## 2020-05-14 NOTE — Anesthesia Preprocedure Evaluation (Addendum)
Anesthesia Evaluation  Patient identified by MRN, date of birth, ID band Patient awake    Reviewed: Allergy & Precautions, NPO status , Patient's Chart, lab work & pertinent test results  Airway Mallampati: II  TM Distance: >3 FB Neck ROM: Full    Dental  (+) Teeth Intact, Dental Advisory Given   Pulmonary former smoker,    breath sounds clear to auscultation       Cardiovascular hypertension,  Rhythm:Regular Rate:Normal     Neuro/Psych    GI/Hepatic   Endo/Other  diabetes  Renal/GU      Musculoskeletal   Abdominal   Peds  Hematology   Anesthesia Other Findings   Reproductive/Obstetrics                            Anesthesia Physical Anesthesia Plan  ASA: III  Anesthesia Plan: General   Post-op Pain Management:    Induction: Intravenous  PONV Risk Score and Plan: Ondansetron and Aprepitant  Airway Management Planned: Oral ETT  Additional Equipment:   Intra-op Plan:   Post-operative Plan: Extubation in OR  Informed Consent: I have reviewed the patients History and Physical, chart, labs and discussed the procedure including the risks, benefits and alternatives for the proposed anesthesia with the patient or authorized representative who has indicated his/her understanding and acceptance.     Dental advisory given  Plan Discussed with: CRNA and Anesthesiologist  Anesthesia Plan Comments: (PAT note by Karoline Caldwell, PA-C: Patient was remotely evaluated by cardiologist Dr. Acie Fredrickson for chest pain and dyspnea. In 2009 he had stress test which was normal. He had an echocardiogram in 2011 which was also normal. More recently he was seen by Dr. Acie Fredrickson 11/06/2019 for orthostatic hypotension and issues with balance. His HCTZ was discontinued he was advised to follow-up with his primary care if his issues with balance and heel-to-toe walking persisted. He was advised to follow-up with  cardiology in 1 year.   Cardiac clearance per telephone encounter 03/13/2020, "Chart reviewed as part of pre-operative protocol coverage. Patient was last seen by Dr. Acie Fredrickson in 11/2019. Patient was contacted today for further pre-op evaluation and reported doing well since last visit. He denies any chest pain, shortness of breath, orthopnea, PND, edema, palpitations, lightheadedness, dizziness, syncope. He is able to complete >4.0 METS without any problems. Given past medical history and time since last visit, based on ACC/AHA guidelines, CAEL WORTH would be at acceptable risk for the planned procedure without further cardiovascular testing. The patient was advised that if he develops new symptoms prior to surgery to contact our office to arrange for a follow-up visit, and he verbalized understanding. Patient is on Aspirin for primary prevention. He has no history of stroke, MI, or PCI. Therefore, OK to hold Aspirin as needed prior to surgery. Would restart when able postoperatively.   OSA on CPAP.  Preop labs reviewed, unremarkable.  EKG 11/06/2019: Sinus bradycardia. Rate 59. First-degree AV block.  TTE 06/03/2009: Impression: Normal LV systolic function. There is mild LVH with impaired LV relaxation. Mild aortic sclerosis/calcification. Trace mitral regurgitation. Trace tricuspid regurgitation. Trivial pulmonic insufficiency.  Rest stress Myoview 05/26/2007: Overall impression: Normal stress nuclear study. No evidence of ischemia. Normal LV function. )       Anesthesia Quick Evaluation

## 2020-05-15 ENCOUNTER — Inpatient Hospital Stay (HOSPITAL_COMMUNITY): Payer: Medicare HMO

## 2020-05-15 ENCOUNTER — Other Ambulatory Visit: Payer: Self-pay

## 2020-05-15 ENCOUNTER — Inpatient Hospital Stay (HOSPITAL_COMMUNITY): Payer: Medicare HMO | Admitting: Anesthesiology

## 2020-05-15 ENCOUNTER — Inpatient Hospital Stay (HOSPITAL_COMMUNITY): Payer: Medicare HMO | Admitting: Physician Assistant

## 2020-05-15 ENCOUNTER — Encounter (HOSPITAL_COMMUNITY): Payer: Self-pay | Admitting: Neurological Surgery

## 2020-05-15 ENCOUNTER — Encounter (HOSPITAL_COMMUNITY)
Admission: RE | Disposition: A | Discharge status: Home / Self Care | Payer: Self-pay | Source: Home / Self Care | Attending: Neurological Surgery

## 2020-05-15 ENCOUNTER — Inpatient Hospital Stay (HOSPITAL_COMMUNITY)
Admission: RE | Admit: 2020-05-15 | Discharge: 2020-05-16 | DRG: 455 | Disposition: A | Discharge status: Home / Self Care | Payer: Medicare HMO | Attending: Neurological Surgery | Admitting: Neurological Surgery

## 2020-05-15 DIAGNOSIS — Z87891 Personal history of nicotine dependence: Secondary | ICD-10-CM | POA: Diagnosis not present

## 2020-05-15 DIAGNOSIS — M48062 Spinal stenosis, lumbar region with neurogenic claudication: Secondary | ICD-10-CM | POA: Diagnosis present

## 2020-05-15 DIAGNOSIS — Z79899 Other long term (current) drug therapy: Secondary | ICD-10-CM

## 2020-05-15 DIAGNOSIS — M4726 Other spondylosis with radiculopathy, lumbar region: Secondary | ICD-10-CM | POA: Diagnosis not present

## 2020-05-15 DIAGNOSIS — M4316 Spondylolisthesis, lumbar region: Secondary | ICD-10-CM | POA: Diagnosis not present

## 2020-05-15 DIAGNOSIS — I10 Essential (primary) hypertension: Secondary | ICD-10-CM | POA: Diagnosis present

## 2020-05-15 DIAGNOSIS — Z419 Encounter for procedure for purposes other than remedying health state, unspecified: Secondary | ICD-10-CM

## 2020-05-15 DIAGNOSIS — Z7984 Long term (current) use of oral hypoglycemic drugs: Secondary | ICD-10-CM | POA: Diagnosis not present

## 2020-05-15 DIAGNOSIS — E1165 Type 2 diabetes mellitus with hyperglycemia: Secondary | ICD-10-CM | POA: Diagnosis not present

## 2020-05-15 DIAGNOSIS — Z791 Long term (current) use of non-steroidal anti-inflammatories (NSAID): Secondary | ICD-10-CM

## 2020-05-15 DIAGNOSIS — Z9889 Other specified postprocedural states: Secondary | ICD-10-CM | POA: Diagnosis not present

## 2020-05-15 DIAGNOSIS — Z20822 Contact with and (suspected) exposure to covid-19: Secondary | ICD-10-CM | POA: Diagnosis not present

## 2020-05-15 DIAGNOSIS — Z7982 Long term (current) use of aspirin: Secondary | ICD-10-CM

## 2020-05-15 DIAGNOSIS — M5136 Other intervertebral disc degeneration, lumbar region: Secondary | ICD-10-CM | POA: Diagnosis present

## 2020-05-15 DIAGNOSIS — M5416 Radiculopathy, lumbar region: Secondary | ICD-10-CM | POA: Diagnosis not present

## 2020-05-15 DIAGNOSIS — M4326 Fusion of spine, lumbar region: Secondary | ICD-10-CM | POA: Diagnosis not present

## 2020-05-15 LAB — GLUCOSE, CAPILLARY
Glucose-Capillary: 132 mg/dL — ABNORMAL HIGH (ref 70–99)
Glucose-Capillary: 146 mg/dL — ABNORMAL HIGH (ref 70–99)
Glucose-Capillary: 161 mg/dL — ABNORMAL HIGH (ref 70–99)
Glucose-Capillary: 180 mg/dL — ABNORMAL HIGH (ref 70–99)

## 2020-05-15 LAB — ABO/RH: ABO/RH(D): A POS

## 2020-05-15 SURGERY — POSTERIOR LUMBAR FUSION 1 LEVEL
Anesthesia: General | Site: Back

## 2020-05-15 MED ORDER — FENTANYL CITRATE (PF) 100 MCG/2ML IJ SOLN: 25.0000 ug | INTRAMUSCULAR | Status: DC | PRN

## 2020-05-15 MED ORDER — ONDANSETRON HCL 4 MG/2ML IJ SOLN
INTRAMUSCULAR | Status: DC | PRN
  Administered 2020-05-15: 4 mg via INTRAVENOUS

## 2020-05-15 MED ORDER — ACETAMINOPHEN 650 MG RE SUPP: 650.0000 mg | RECTAL | Status: DC | PRN

## 2020-05-15 MED ORDER — MENTHOL 3 MG MT LOZG: 1.0000 | LOZENGE | OROMUCOSAL | Status: DC | PRN

## 2020-05-15 MED ORDER — POLYETHYLENE GLYCOL 3350 17 G PO PACK: 17.0000 g | PACK | Freq: Every day | ORAL | Status: DC | PRN

## 2020-05-15 MED ORDER — CHLORHEXIDINE GLUCONATE 0.12 % MT SOLN
15.0000 mL | Freq: Once | OROMUCOSAL | Status: AC
  Administered 2020-05-15: 15 mL via OROMUCOSAL
  Filled 2020-05-15: qty 15

## 2020-05-15 MED ORDER — PHENYLEPHRINE HCL (PRESSORS) 10 MG/ML IV SOLN
INTRAVENOUS | Status: DC | PRN
  Administered 2020-05-15 (×2): 40 ug via INTRAVENOUS
  Administered 2020-05-15: 80 ug via INTRAVENOUS

## 2020-05-15 MED ORDER — OXYCODONE HCL 5 MG PO TABS: 5.0000 mg | ORAL_TABLET | Freq: Once | ORAL | Status: DC | PRN

## 2020-05-15 MED ORDER — LIDOCAINE HCL (CARDIAC) PF 100 MG/5ML IV SOSY
PREFILLED_SYRINGE | INTRAVENOUS | Status: DC | PRN
  Administered 2020-05-15: 100 mg via INTRATRACHEAL

## 2020-05-15 MED ORDER — ONDANSETRON HCL 4 MG PO TABS: 4.0000 mg | ORAL_TABLET | Freq: Four times a day (QID) | ORAL | Status: DC | PRN

## 2020-05-15 MED ORDER — THROMBIN 5000 UNITS EX SOLR
OROMUCOSAL | Status: DC | PRN
  Administered 2020-05-15: 20 mL via TOPICAL

## 2020-05-15 MED ORDER — FENTANYL CITRATE (PF) 250 MCG/5ML IJ SOLN
INTRAMUSCULAR | Status: AC
  Filled 2020-05-15: qty 5

## 2020-05-15 MED ORDER — SODIUM CHLORIDE 0.9% FLUSH: 3.0000 mL | INTRAVENOUS | Status: DC | PRN

## 2020-05-15 MED ORDER — ORAL CARE MOUTH RINSE: 15.0000 mL | Freq: Once | OROMUCOSAL | Status: AC

## 2020-05-15 MED ORDER — ROCURONIUM BROMIDE 100 MG/10ML IV SOLN
INTRAVENOUS | Status: DC | PRN
  Administered 2020-05-15: 100 mg via INTRAVENOUS
  Administered 2020-05-15 (×2): 20 mg via INTRAVENOUS

## 2020-05-15 MED ORDER — 0.9 % SODIUM CHLORIDE (POUR BTL) OPTIME
TOPICAL | Status: DC | PRN
  Administered 2020-05-15: 1000 mL

## 2020-05-15 MED ORDER — ONDANSETRON HCL 4 MG/2ML IJ SOLN: 4.0000 mg | Freq: Once | INTRAMUSCULAR | Status: DC | PRN

## 2020-05-15 MED ORDER — METHOCARBAMOL 500 MG PO TABS
500.0000 mg | ORAL_TABLET | Freq: Four times a day (QID) | ORAL | Status: DC | PRN
  Administered 2020-05-15: 500 mg via ORAL
  Filled 2020-05-15: qty 1

## 2020-05-15 MED ORDER — BUPIVACAINE HCL (PF) 0.5 % IJ SOLN
INTRAMUSCULAR | Status: DC | PRN
  Administered 2020-05-15: 25 mL
  Administered 2020-05-15: 5 mL

## 2020-05-15 MED ORDER — IRBESARTAN-HYDROCHLOROTHIAZIDE 150-12.5 MG PO TABS: 1.0000 | ORAL_TABLET | Freq: Every day | ORAL | Status: DC

## 2020-05-15 MED ORDER — THROMBIN 5000 UNITS EX SOLR
CUTANEOUS | Status: AC
  Filled 2020-05-15: qty 5000

## 2020-05-15 MED ORDER — DOCUSATE SODIUM 100 MG PO CAPS
100.0000 mg | ORAL_CAPSULE | Freq: Two times a day (BID) | ORAL | Status: DC
  Administered 2020-05-15 – 2020-05-16 (×2): 100 mg via ORAL
  Filled 2020-05-15 (×2): qty 1

## 2020-05-15 MED ORDER — LIDOCAINE-EPINEPHRINE 1 %-1:100000 IJ SOLN
INTRAMUSCULAR | Status: DC | PRN
  Administered 2020-05-15: 5 mL

## 2020-05-15 MED ORDER — ALUM & MAG HYDROXIDE-SIMETH 200-200-20 MG/5ML PO SUSP: 30.0000 mL | Freq: Four times a day (QID) | ORAL | Status: DC | PRN

## 2020-05-15 MED ORDER — PROPOFOL 10 MG/ML IV BOLUS
INTRAVENOUS | Status: AC
  Filled 2020-05-15: qty 20

## 2020-05-15 MED ORDER — KETOROLAC TROMETHAMINE 15 MG/ML IJ SOLN
7.5000 mg | Freq: Four times a day (QID) | INTRAMUSCULAR | Status: AC
  Administered 2020-05-15 – 2020-05-16 (×4): 7.5 mg via INTRAVENOUS
  Filled 2020-05-15 (×4): qty 1

## 2020-05-15 MED ORDER — METHOCARBAMOL 1000 MG/10ML IJ SOLN
500.0000 mg | Freq: Four times a day (QID) | INTRAVENOUS | Status: DC | PRN
  Filled 2020-05-15: qty 5

## 2020-05-15 MED ORDER — SODIUM CHLORIDE 0.9 % IV SOLN: 250.0000 mL | INTRAVENOUS | Status: DC

## 2020-05-15 MED ORDER — PROPOFOL 10 MG/ML IV BOLUS
INTRAVENOUS | Status: DC | PRN
  Administered 2020-05-15: 140 mg via INTRAVENOUS

## 2020-05-15 MED ORDER — INSULIN ASPART 100 UNIT/ML ~~LOC~~ SOLN
0.0000 [IU] | Freq: Three times a day (TID) | SUBCUTANEOUS | Status: DC
  Administered 2020-05-15: 4 [IU] via SUBCUTANEOUS
  Administered 2020-05-16: 3 [IU] via SUBCUTANEOUS

## 2020-05-15 MED ORDER — SENNA 8.6 MG PO TABS
1.0000 | ORAL_TABLET | Freq: Two times a day (BID) | ORAL | Status: DC
  Administered 2020-05-15: 8.6 mg via ORAL
  Filled 2020-05-15: qty 1

## 2020-05-15 MED ORDER — THROMBIN 20000 UNITS EX SOLR
CUTANEOUS | Status: AC
  Filled 2020-05-15: qty 20000

## 2020-05-15 MED ORDER — LACTATED RINGERS IV SOLN: INTRAVENOUS | Status: DC

## 2020-05-15 MED ORDER — ACETAMINOPHEN 325 MG PO TABS: 650.0000 mg | ORAL_TABLET | ORAL | Status: DC | PRN

## 2020-05-15 MED ORDER — ONDANSETRON HCL 4 MG/2ML IJ SOLN: 4.0000 mg | Freq: Four times a day (QID) | INTRAMUSCULAR | Status: DC | PRN

## 2020-05-15 MED ORDER — PHENYLEPHRINE HCL-NACL 10-0.9 MG/250ML-% IV SOLN
INTRAVENOUS | Status: DC | PRN
  Administered 2020-05-15: 30 ug/min via INTRAVENOUS

## 2020-05-15 MED ORDER — OXYCODONE HCL 5 MG/5ML PO SOLN: 5.0000 mg | Freq: Once | ORAL | Status: DC | PRN

## 2020-05-15 MED ORDER — FLEET ENEMA 7-19 GM/118ML RE ENEM: 1.0000 | ENEMA | Freq: Once | RECTAL | Status: DC | PRN

## 2020-05-15 MED ORDER — DEXAMETHASONE SODIUM PHOSPHATE 10 MG/ML IJ SOLN
INTRAMUSCULAR | Status: DC | PRN
  Administered 2020-05-15: 5 mg via INTRAVENOUS

## 2020-05-15 MED ORDER — BUPIVACAINE HCL (PF) 0.5 % IJ SOLN
INTRAMUSCULAR | Status: AC
  Filled 2020-05-15: qty 30

## 2020-05-15 MED ORDER — PHENOL 1.4 % MT LIQD: 1.0000 | OROMUCOSAL | Status: DC | PRN

## 2020-05-15 MED ORDER — FENTANYL CITRATE (PF) 250 MCG/5ML IJ SOLN
INTRAMUSCULAR | Status: DC | PRN
  Administered 2020-05-15: 50 ug via INTRAVENOUS
  Administered 2020-05-15 (×2): 100 ug via INTRAVENOUS

## 2020-05-15 MED ORDER — LIDOCAINE-EPINEPHRINE 1 %-1:100000 IJ SOLN
INTRAMUSCULAR | Status: AC
  Filled 2020-05-15: qty 1

## 2020-05-15 MED ORDER — SODIUM CHLORIDE 0.9% FLUSH
3.0000 mL | Freq: Two times a day (BID) | INTRAVENOUS | Status: DC
  Administered 2020-05-15: 3 mL via INTRAVENOUS

## 2020-05-15 MED ORDER — MORPHINE SULFATE (PF) 2 MG/ML IV SOLN
2.0000 mg | INTRAVENOUS | Status: DC | PRN
Start: 2020-05-15 — End: 2020-05-16

## 2020-05-15 MED ORDER — CEFAZOLIN SODIUM-DEXTROSE 2-4 GM/100ML-% IV SOLN
2.0000 g | Freq: Three times a day (TID) | INTRAVENOUS | Status: AC
  Administered 2020-05-15 (×2): 2 g via INTRAVENOUS
  Filled 2020-05-15 (×2): qty 100

## 2020-05-15 MED ORDER — BISACODYL 10 MG RE SUPP: 10.0000 mg | Freq: Every day | RECTAL | Status: DC | PRN

## 2020-05-15 MED ORDER — METFORMIN HCL ER 500 MG PO TB24
500.0000 mg | ORAL_TABLET | Freq: Every day | ORAL | Status: DC
  Administered 2020-05-16: 500 mg via ORAL
  Filled 2020-05-15: qty 1

## 2020-05-15 MED ORDER — SUGAMMADEX SODIUM 200 MG/2ML IV SOLN
INTRAVENOUS | Status: DC | PRN
  Administered 2020-05-15: 200 mg via INTRAVENOUS

## 2020-05-15 MED ORDER — EPHEDRINE SULFATE 50 MG/ML IJ SOLN
INTRAMUSCULAR | Status: DC | PRN
  Administered 2020-05-15: 5 mg via INTRAVENOUS

## 2020-05-15 MED ORDER — CEFAZOLIN SODIUM-DEXTROSE 2-4 GM/100ML-% IV SOLN
2.0000 g | INTRAVENOUS | Status: AC
  Administered 2020-05-15: 2 g via INTRAVENOUS
  Filled 2020-05-15: qty 100

## 2020-05-15 MED ORDER — OXYCODONE-ACETAMINOPHEN 5-325 MG PO TABS
1.0000 | ORAL_TABLET | ORAL | Status: DC | PRN
  Administered 2020-05-15 – 2020-05-16 (×2): 1 via ORAL
  Filled 2020-05-15 (×2): qty 1

## 2020-05-15 MED ORDER — ALBUMIN HUMAN 5 % IV SOLN: INTRAVENOUS | Status: DC | PRN

## 2020-05-15 MED ORDER — ESCITALOPRAM OXALATE 10 MG PO TABS
10.0000 mg | ORAL_TABLET | Freq: Every day | ORAL | Status: DC
  Administered 2020-05-16: 10 mg via ORAL
  Filled 2020-05-15: qty 1

## 2020-05-15 SURGICAL SUPPLY — 69 items
ADH SKN CLS APL DERMABOND .7 (GAUZE/BANDAGES/DRESSINGS) ×1
APL SRG 60D 8 XTD TIP BNDBL (TIP)
BASKET BONE COLLECTION (BASKET) ×2 IMPLANT
BLADE CLIPPER SURG (BLADE) ×1 IMPLANT
BONE CANC CHIPS 20CC PCAN1/4 (Bone Implant) ×2 IMPLANT
BUR MATCHSTICK NEURO 3.0 LAGG (BURR) ×2 IMPLANT
CAGE COROENT LG 12X9X23-12 (Cage) ×2 IMPLANT
CANISTER SUCT 3000ML PPV (MISCELLANEOUS) ×2 IMPLANT
CHIPS CANC BONE 20CC PCAN1/4 (Bone Implant) ×1 IMPLANT
CNTNR URN SCR LID CUP LEK RST (MISCELLANEOUS) ×1 IMPLANT
CONT SPEC 4OZ STRL OR WHT (MISCELLANEOUS) ×2
COVER BACK TABLE 60X90IN (DRAPES) ×2 IMPLANT
COVER WAND RF STERILE (DRAPES) ×2 IMPLANT
DECANTER SPIKE VIAL GLASS SM (MISCELLANEOUS) ×2 IMPLANT
DERMABOND ADVANCED (GAUZE/BANDAGES/DRESSINGS) ×1
DERMABOND ADVANCED .7 DNX12 (GAUZE/BANDAGES/DRESSINGS) ×1 IMPLANT
DEVICE DISSECT PLASMABLAD 3.0S (MISCELLANEOUS) ×1 IMPLANT
DRAPE C-ARM 42X72 X-RAY (DRAPES) ×4 IMPLANT
DRAPE HALF SHEET 40X57 (DRAPES) IMPLANT
DRAPE LAPAROTOMY 100X72X124 (DRAPES) ×2 IMPLANT
DRSG OPSITE POSTOP 4X6 (GAUZE/BANDAGES/DRESSINGS) ×1 IMPLANT
DURAPREP 26ML APPLICATOR (WOUND CARE) ×2 IMPLANT
DURASEAL APPLICATOR TIP (TIP) IMPLANT
DURASEAL SPINE SEALANT 3ML (MISCELLANEOUS) IMPLANT
ELECT REM PT RETURN 9FT ADLT (ELECTROSURGICAL) ×2
ELECTRODE REM PT RTRN 9FT ADLT (ELECTROSURGICAL) ×1 IMPLANT
GAUZE 4X4 16PLY RFD (DISPOSABLE) IMPLANT
GAUZE SPONGE 4X4 12PLY STRL (GAUZE/BANDAGES/DRESSINGS) ×2 IMPLANT
GLOVE BIOGEL PI IND STRL 8.5 (GLOVE) ×2 IMPLANT
GLOVE BIOGEL PI INDICATOR 8.5 (GLOVE) ×2
GLOVE ECLIPSE 8.5 STRL (GLOVE) ×4 IMPLANT
GLOVE SURG UNDER POLY LF SZ6.5 (GLOVE) ×2 IMPLANT
GLOVE SURG UNDER POLY LF SZ7.5 (GLOVE) ×2 IMPLANT
GOWN STRL REUS W/ TWL LRG LVL3 (GOWN DISPOSABLE) IMPLANT
GOWN STRL REUS W/ TWL XL LVL3 (GOWN DISPOSABLE) IMPLANT
GOWN STRL REUS W/TWL 2XL LVL3 (GOWN DISPOSABLE) ×4 IMPLANT
GOWN STRL REUS W/TWL LRG LVL3 (GOWN DISPOSABLE) ×2
GOWN STRL REUS W/TWL XL LVL3 (GOWN DISPOSABLE) ×4
GRAFT BNE CANC CHIPS 1-8 20CC (Bone Implant) IMPLANT
GRAFT BONE PROTEIOS LRG 5CC (Orthopedic Implant) ×1 IMPLANT
HEMOSTAT POWDER KIT SURGIFOAM (HEMOSTASIS) ×1 IMPLANT
KIT BASIN OR (CUSTOM PROCEDURE TRAY) ×2 IMPLANT
KIT TURNOVER KIT B (KITS) ×2 IMPLANT
MILL MEDIUM DISP (BLADE) ×2 IMPLANT
NDL HYPO 18GX1.5 BLUNT FILL (NEEDLE) IMPLANT
NEEDLE HYPO 18GX1.5 BLUNT FILL (NEEDLE) ×2 IMPLANT
NEEDLE HYPO 22GX1.5 SAFETY (NEEDLE) ×2 IMPLANT
NS IRRIG 1000ML POUR BTL (IV SOLUTION) ×2 IMPLANT
PACK LAMINECTOMY NEURO (CUSTOM PROCEDURE TRAY) ×2 IMPLANT
PAD ARMBOARD 7.5X6 YLW CONV (MISCELLANEOUS) ×3 IMPLANT
PATTIES SURGICAL .5 X1 (DISPOSABLE) ×2 IMPLANT
PLASMABLADE 3.0S (MISCELLANEOUS) ×2
ROD RELINE-O LORD 5.5X40 (Rod) ×2 IMPLANT
SCREW LOCK RELINE 5.5 TULIP (Screw) ×4 IMPLANT
SCREW RELINE-O POLY 6.5X45 (Screw) ×4 IMPLANT
SPONGE LAP 4X18 RFD (DISPOSABLE) IMPLANT
SPONGE SURGIFOAM ABS GEL 100 (HEMOSTASIS) ×2 IMPLANT
SUT PROLENE 6 0 BV (SUTURE) IMPLANT
SUT VIC AB 1 CT1 18XBRD ANBCTR (SUTURE) ×1 IMPLANT
SUT VIC AB 1 CT1 8-18 (SUTURE) ×2
SUT VIC AB 2-0 CP2 18 (SUTURE) ×2 IMPLANT
SUT VIC AB 3-0 SH 8-18 (SUTURE) ×2 IMPLANT
SUT VIC AB 4-0 RB1 18 (SUTURE) ×2 IMPLANT
SYR 3ML LL SCALE MARK (SYRINGE) ×8 IMPLANT
SYR CONTROL 10ML LL (SYRINGE) ×2 IMPLANT
TOWEL GREEN STERILE (TOWEL DISPOSABLE) ×2 IMPLANT
TOWEL GREEN STERILE FF (TOWEL DISPOSABLE) ×2 IMPLANT
TRAY FOLEY MTR SLVR 16FR STAT (SET/KITS/TRAYS/PACK) ×2 IMPLANT
WATER STERILE IRR 1000ML POUR (IV SOLUTION) ×2 IMPLANT

## 2020-05-15 NOTE — Op Note (Signed)
Date of surgery: 05/15/2020 Preoperative diagnosis: Spondylolisthesis L4-L5 with lumbar radiculopathy, neurogenic claudication. Postoperative diagnosis: Same Procedure: Lumbar laminectomy L4-L5 decompression of the L4 and L5 nerve roots with more work than required for simple interbody fusion.  Posterior lumbar interbody arthrodesis with peek spacers local autograft allograft and proos supplement Surgeon: Kristeen Miss First Assistant: Duffy Rhody, MD Anesthesia: General endotracheal Indications: Ricardo [name] is a 80 year old individual who has had significant back and bilateral lower extremity pain and weakness.  He has a profound spondylolisthesis with a high-grade stenosis at L4-L5 bilateral nerve root cut off for the L4 nerve root superiorly and L5 nerve roots inferiorly.  Had a previous laminectomy years ago.  Is been advised regarding the need for surgery to decompress and stabilize the L4-L5 level.  Procedure: Patient was brought to the operating room supine on the stretcher.  After the smooth induction of general endotracheal anesthesia, he was carefully turned prone.  The back was prepped with alcohol DuraPrep and draped in a sterile fashion.  Midline incision was created and carried down to the lumbodorsal fascia this was opened on either side of midline and for spinous processes palpable were identified positively as L4 and L5.  Then by further dissecting the interlaminar space of L4-L5 was identified and this was verified with a second radiograph.  Then laminotomies were created removing the inferior margin lamina of L4 out to including the entirety of the facet at L4-5.  The thickened redundant yellow ligament was then taken down care full dissection was performed around the dural tube and takeoff the L5 nerve root inferiorly.  Superiorly the L4 nerve root was decompressed into his travel at the foramen.  A substantial portion of the superior articular process of L5 was removed to enhance  this.  The disc space was then isolated at L4-L5.  This was done bilaterally.  The disc space was then entered first on the left side and then on the right side and a discectomy was performed removing a substantial quantity of severely degenerated desiccated disc material.  Once the discectomy was completed and the interspace was decompressed interbody spacer was sized for the appropriate size height is felt that a 12 x 9 x 23 mm spacer with 12 degrees of lordosis would fit best and provide adequate distraction and decompression.  This was filled with a combination of autograft and allograft and a bone morphogenic substitute called pro-osteocell.  This was packed into the interspace and into the spacer along with 20 cc of allograft bone chips.  A total of 16 cc of bone graft was packed into the interspace along with the 2 cages.  Then pedicle entry sites were chosen at L4 and L5 and the intertransverse space was decorticated between L4 and L5.  An additional 6 cc of bone graft was packed into each lateral gutter at this point and pedicle entry sites were chosen and then under fluoroscopic guidance 6.5 x 45 mm pedicle screws were placed into L4 and L5.  Care was taken to make sure that there was no cut out 40 mm precontoured rods were then used to connect the pedicle screws together.  With the lateral gutters packed and the final fixation being performed final radiographs were obtained and then the area was inspected to make sure that there were no bone chips in about the spinal canal or the nerve roots and hemostasis was well verified once this was ascertained the lumbodorsal fascia was closed with #1 Vicryl in interrupted fashion 2-0  Vicryl was used in subcutaneous tissues and 3-0 Vicryl subcuticularly Dermabond was placed on the skin.  Blood loss was estimated 300 cc.  Patient tolerated procedure well is returned to recovery room stable condition.

## 2020-05-15 NOTE — H&P (Signed)
CHIEF COMPLAINT: Back pain, leg pain, weakness that has been going on for months to years.  HISTORY OF PRESENT ILLNESS: Ricardo [name] is a 80 year old right-handed individual, who tells me he has had back problems for a number of years. Back in 2017, he had been seen by Dr. Lindwood Qua and a myelogram was performed.  At that time, it was noted that he had a spondylolisthesis at L4-5 and some moderate spondylosis at L3-4 and he had been suggested some conservative therapy. Patient notes that he has been developing worsening right lumbar radiculopathy and this would manifest itself initially just when he was driving for a long period of time in his right leg, but lately here, it has been getting worse on the right leg, and he feels that there may be some weakness.  He is certainly having some difficulty with his balance as he feels unsteady on his feet walking any distances.  He notes that being up on his feet and his legs will aggravate the pain substantially, and even at night, he feels a stiffness in his back with the pain radiating into the lower extremities.  He was seen by Dr. Philip Aspen, who ordered an MRI back in October, and this study is brought in for review.  The study demonstrates that the patient has a high-grade stenosis secondary to spondylolisthesis at the L4-L5 level.  There is both central and lateral recess stenosis and subarticular stenosis for the L5 nerve root as it tries to enter the foramen. L5-S1 has a herniated nucleus pulposus on the left side that may affect the left L5 nerve root, but he notes that most of his symptoms are on that right lower extremity.  Plain x-rays had been performed back in 2017 with a myelogram, and these demonstrated that he has a grade 1 spondylolisthesis.  Based on the MRI at the current time, I would suggest that he has increased the spondylolisthesis to a grade 2 level.  PAST MEDICAL HISTORY: Reveals that his general health has been good.  He does have  some low-grade diabetes.  He notes his A1c has been below 6, but he has had some elevated blood sugars.  He is currently taking metformin for this process.  Beyond that, he notes that there is some hypertension.  CURRENT MEDICATIONS: Include Lexapro 20 mg, a Baby Aspirin, Aleve, and some Metformin.  PHYSICAL EXAMINATION: The patient does stand straight and erect, but I note that when he ambulates, he tends to favor a 5 degree forward stoop.  His motor strength is good in the iliopsoas and the quadriceps, but the tibialis anterior in the right lower extremity demonstrates some weakness on heel walking at 4/5 compared to the left side.  Straight leg raising is positive at 15 degrees in either lower extremity.  His Patrick maneuver appears negative.  Tone and bulk in the major groups of his lower extremities also are intact.  IMPRESSION: The patient has an advanced spondylolisthesis at L4-L5 with a high-grade stenosis at that level.  He has weakness in the tibialis anterior on that right side, and he notes that his stamina on his feet, along with his steadiness, has severely deteriorated.  I have advised that the patient would be best served with a 1 level decompression and arthrodesis.  I note that, given the weakness, I am particularly concerned of worsening weakness with the passage of time, and he notes that this condition has certainly progressed, as his films demonstrate from 2017 to the current  time, when back then he had a mild-to-moderate stenosis; now he has a high-grade stenosis at the L4-5 level.  I have advised and demonstrated on a model the nature of 1 level decompression and fusion at L4-L5.  Typically, the surgery takes about 3 hours to do; patients are in the hospital usually for an overnight, sometimes for 2 days.  Beyond that, he has to wear a corset brace during his recovery for a period of about 6-8 weeks after surgery and then the patients are mobilized without the brace to increase the  strength in their back and their legs.  We can plan the surgery at his convenience.  I discussed with him briefly whether further conservative management would be appropriate, and I am concerned, because of the degree of weakness and symptoms that he is experiencing, that further conservative effort will only delay his ultimate need for surgical decompression and stabilization.

## 2020-05-15 NOTE — Anesthesia Procedure Notes (Signed)
Procedure Name: Intubation Date/Time: 05/15/2020 7:59 AM Performed by: Mariea Clonts, CRNA Pre-anesthesia Checklist: Patient identified, Emergency Drugs available, Suction available and Patient being monitored Patient Re-evaluated:Patient Re-evaluated prior to induction Oxygen Delivery Method: Circle System Utilized Preoxygenation: Pre-oxygenation with 100% oxygen Induction Type: IV induction and Cricoid Pressure applied Ventilation: Mask ventilation without difficulty Laryngoscope Size: Miller and 2 Grade View: Grade II Tube type: Oral Tube size: 8.0 mm Number of attempts: 1 Airway Equipment and Method: Stylet and Oral airway Placement Confirmation: ETT inserted through vocal cords under direct vision,  positive ETCO2 and breath sounds checked- equal and bilateral Tube secured with: Tape Dental Injury: Teeth and Oropharynx as per pre-operative assessment

## 2020-05-15 NOTE — Transfer of Care (Signed)
Immediate Anesthesia Transfer of Care Note  Patient: Ricardo [name]  Procedure(s) Performed: Lumbar four-five Posterior lumbar interbody fusion (N/A Back)  Patient Location: PACU  Anesthesia Type:General  Level of Consciousness: awake, alert  and oriented  Airway & Oxygen Therapy: Patient Spontanous Breathing and Patient connected to nasal cannula oxygen  Post-op Assessment: Report given to RN, Post -op Vital signs reviewed and stable and Patient moving all extremities X 4  Post vital signs: Reviewed and stable  Last Vitals:  Vitals Value Taken Time  BP 134/76 05/15/20 1150  Temp    Pulse 82 05/15/20 1154  Resp 14 05/15/20 1154  SpO2 93 % 05/15/20 1154  Vitals shown include unvalidated device data.  Last Pain:  Vitals:   05/15/20 0617  TempSrc: Oral  PainSc:          Complications: No complications documented.

## 2020-05-15 NOTE — Anesthesia Postprocedure Evaluation (Signed)
Anesthesia Post Note  Patient: Ricardo [name]  Procedure(s) Performed: Lumbar four-five Posterior lumbar interbody fusion (N/A Back)     Patient location during evaluation: PACU Anesthesia Type: General Level of consciousness: awake and alert Pain management: pain level controlled Vital Signs Assessment: post-procedure vital signs reviewed and stable Respiratory status: spontaneous breathing, nonlabored ventilation, respiratory function stable and patient connected to nasal cannula oxygen Cardiovascular status: blood pressure returned to baseline and stable Postop Assessment: no apparent nausea or vomiting Anesthetic complications: no   No complications documented.  Last Vitals:  Vitals:   05/15/20 1254 05/15/20 1627  BP: 128/74 136/77  Pulse: 68 76  Resp: 18 17  Temp: 36.9 C 36.6 C  SpO2: 94% 94%    Last Pain:  Vitals:   05/15/20 1627  TempSrc: Oral  PainSc:                  Kohle Winner COKER

## 2020-05-15 NOTE — Progress Notes (Signed)
Orthopedic Tech Progress Note Patient Details:  Ricardo [name] 1940-05-06 [id number] Dropped off brace Patient ID: Ricardo [name], male   DOB: 1940/12/09, 80 y.o.   MRN: [id number]   Ricardo [name] 05/15/2020, 3:13 PM

## 2020-05-16 LAB — CBC
HCT: 33.8 % — ABNORMAL LOW (ref 39.0–52.0)
Hemoglobin: 11.6 g/dL — ABNORMAL LOW (ref 13.0–17.0)
MCH: 30.7 pg (ref 26.0–34.0)
MCHC: 34.3 g/dL (ref 30.0–36.0)
MCV: 89.4 fL (ref 80.0–100.0)
Platelets: 174 10*3/uL (ref 150–400)
RBC: 3.78 MIL/uL — ABNORMAL LOW (ref 4.22–5.81)
RDW: 13.1 % (ref 11.5–15.5)
WBC: 10.8 10*3/uL — ABNORMAL HIGH (ref 4.0–10.5)
nRBC: 0 % (ref 0.0–0.2)

## 2020-05-16 LAB — BASIC METABOLIC PANEL
Anion gap: 10 (ref 5–15)
BUN: 22 mg/dL (ref 8–23)
CO2: 25 mmol/L (ref 22–32)
Calcium: 8.5 mg/dL — ABNORMAL LOW (ref 8.9–10.3)
Chloride: 104 mmol/L (ref 98–111)
Creatinine, Ser: 1.15 mg/dL (ref 0.61–1.24)
GFR, Estimated: >60 mL/min (ref >60)
Glucose, Bld: 160 mg/dL — ABNORMAL HIGH (ref 70–99)
Potassium: 4.1 mmol/L (ref 3.5–5.1)
Sodium: 139 mmol/L (ref 135–145)

## 2020-05-16 LAB — GLUCOSE, CAPILLARY: Glucose-Capillary: 147 mg/dL — ABNORMAL HIGH (ref 70–99)

## 2020-05-16 MED ORDER — OXYCODONE-ACETAMINOPHEN 5-325 MG PO TABS: 1.0000 | ORAL_TABLET | ORAL | 0 refills | Status: DC | PRN

## 2020-05-16 MED ORDER — METHOCARBAMOL 500 MG PO TABS: 500.0000 mg | ORAL_TABLET | Freq: Four times a day (QID) | ORAL | 3 refills | Status: DC | PRN

## 2020-05-16 NOTE — Evaluation (Signed)
Physical Therapy Evaluation Patient Details Name: Ricardo [name] MRN: [id number] DOB: Jan 14, 1941 Today's Date: 05/16/2020   History of Present Illness  80 y.o. male presenting with herniated nucleus pulposus T7-T8 with myelopathy s/p laminectomy and facetectomy for decompression. PMHx significant for anxiety, lumbago, Hx L THA 01/2019, DMII, HLD, HTN & OA.  Clinical Impression  Pt presents to PT s/p spinal surgery. Pt is able to ambulate, transfer, and negotiate stairs without physical assistance. Pt has 24/7 assistance available from spouse. Pt is able to recall all back precautions at this time and feels confident in his mobility at this time. Pt is encouraged to ambulate multiple times each day for the remainder of his admission. Pt requires no further acute PT services. PT recommends no PT or DME needs at this time. Acute PT signing off.    Follow Up Recommendations No PT follow up    Equipment Recommendations  None recommended by PT    Recommendations for Other Services       Precautions / Restrictions Precautions Precautions: Back Precaution Booklet Issued: Yes (comment) Precaution Comments: Written handout provided. Patient able to recall 3/3 back precautions. Occasional cues required during ADLs. Required Braces or Orthoses: Spinal Brace Spinal Brace: Lumbar corset Restrictions Weight Bearing Restrictions: No      Mobility  Bed Mobility               General bed mobility comments: Patient seated in recliner upon entry.    Transfers Overall transfer level: Modified independent Equipment used: None             General transfer comment: Sit to stand x3 trials from EOB and low recliner with Mod I. No AD.  Ambulation/Gait Ambulation/Gait assistance: Supervision Gait Distance (Feet): 300 Feet Assistive device: None Gait Pattern/deviations: Step-through pattern Gait velocity: functional Gait velocity interpretation: 1.31 - 2.62 ft/sec, indicative of  limited community ambulator General Gait Details: pt with slow but steady step-through gait  Stairs Stairs: Yes Stairs assistance: Supervision Stair Management: One rail Left;Step to pattern Number of Stairs: 3    Wheelchair Mobility    Modified Rankin (Stroke Patients Only)       Balance Overall balance assessment: No apparent balance deficits (not formally assessed)                                           Pertinent Vitals/Pain Pain Assessment: 0-10 Pain Score: 2  Pain Location: Back (incision) Pain Descriptors / Indicators: Aching;Sore Pain Intervention(s): Monitored during session;Premedicated before session;Repositioned;Limited activity within patient's tolerance    Home Living Family/patient expects to be discharged to:: Private residence Living Arrangements: Spouse/significant other Available Help at Discharge: Family;Available 24 hours/day (Wife only able to proivde supervision. Cannot provide physical assist.) Type of Home: House Home Access: Stairs to enter Entrance Stairs-Rails: Left Entrance Stairs-Number of Steps: 3 from garage Home Layout: Two level;Able to live on main level with bedroom/bathroom Home Equipment: Hand held shower head;Grab bars - tub/shower;Shower seat - built in;Shower seat;Cane - single point (wife has RW and rollator)      Prior Function Level of Independence: Independent               Hand Dominance   Dominant Hand: Right    Extremity/Trunk Assessment   Upper Extremity Assessment Upper Extremity Assessment: Overall WFL for tasks assessed    Lower Extremity Assessment Lower Extremity Assessment: Defer to  PT evaluation    Cervical / Trunk Assessment Cervical / Trunk Assessment: Other exceptions Cervical / Trunk Exceptions: s/p spinal surgery  Communication   Communication: No difficulties  Cognition Arousal/Alertness: Awake/alert Behavior During Therapy: WFL for tasks assessed/performed Overall  Cognitive Status: Within Functional Limits for tasks assessed                                        General Comments General comments (skin integrity, edema, etc.): Clean, dry dressing at incision.    Exercises     Assessment/Plan    PT Assessment Patent does not need any further PT services  PT Problem List         PT Treatment Interventions      PT Goals (Current goals can be found in the Care Plan section)  Acute Rehab PT Goals Patient Stated Goal: To return home.    Frequency     Barriers to discharge        Co-evaluation               AM-PAC PT "6 Clicks" Mobility  Outcome Measure Help needed turning from your back to your side while in a flat bed without using bedrails?: None Help needed moving from lying on your back to sitting on the side of a flat bed without using bedrails?: None Help needed moving to and from a bed to a chair (including a wheelchair)?: None Help needed standing up from a chair using your arms (e.g., wheelchair or bedside chair)?: None Help needed to walk in hospital room?: A Little Help needed climbing 3-5 steps with a railing? : A Little 6 Click Score: 22    End of Session Equipment Utilized During Treatment: Back brace Activity Tolerance: Patient tolerated treatment well Patient left: in chair;with call bell/phone within reach Nurse Communication: Mobility status      Time: 9509-3267 PT Time Calculation (min) (ACUTE ONLY): 27 min   Charges:   PT Evaluation $PT Eval Low Complexity: [place], PT, DPT Acute Rehabilitation Pager: [phone]   Zenaida Niece 05/16/2020, 10:17 AM

## 2020-05-16 NOTE — Progress Notes (Signed)
Patient is discharged from room 3C07 at this time. Alert and in stable condition, with no c/o pain.  IV site d/c'd and instructions read to patient with understanding verbalized and all questions answered. Left unit via wheelchair with all belongings at side.

## 2020-05-16 NOTE — Discharge Instructions (Signed)
Wound Care Remove outer dressing in 2-3 days Leave incision open to air. You may shower. Do not scrub directly on incision.  Do not put any creams, lotions, or ointments on incision. Activity Walk each and every day, increasing distance each day. No lifting greater than 5 lbs.  Avoid bending, arching, and twisting. No driving for 2 weeks; may ride as a passenger locally. If provided with back brace, wear when out of bed.  It is not necessary to wear in bed. Diet Resume your normal diet.   Call Your Doctor If Any of These Occur Redness, drainage, or swelling at the wound.  Temperature greater than 101 degrees. Severe pain not relieved by pain medication. Incision starts to come apart. Follow Up Appt Call today for appointment in 2-3 weeks ([phone]) or for problems.  If you have any hardware placed in your spine, you will need an x-ray before your appointment.

## 2020-05-16 NOTE — Evaluation (Signed)
Occupational Therapy Evaluation Patient Details Name: Ricardo [name] MRN: [id number] DOB: 1940-09-20 Today's Date: 05/16/2020    History of Present Illness 80 y.o. male presenting with herniated nucleus pulposus T7-T8 with myelopathy s/p laminectomy and facetectomy for decompression. PMHx significant for anxiety, lumbago, Hx L THA 01/2019, DMII, HLD, HTN & OA.   Clinical Impression   PTA patient was living with his wife in a private residence and was independent with ADLs/IADLs without AD. Patient currently presents near baseline level of function demonstrating Mod I grossly for observed ADLs, ADL transfers, and household mobility without AD. Occasional cues required to maintain spinal precautions during functional tasks. OT provided education on spinal precautions, home set-up to maximize safety and independence with self-care tasks, and acquisition/use of AE. Patient expressed verbal understanding. Patient also able to doff/don Aspen lumbar corset without external assist. Patient does not require continued acute occupational therapy services with OT to sign off at this time.      Follow Up Recommendations  No OT follow up    Equipment Recommendations  None recommended by OT    Recommendations for Other Services       Precautions / Restrictions Precautions Precautions: Back Precaution Booklet Issued: Yes (comment) Precaution Comments: Written handout provided. Patient able to recall 3/3 back precautions. Occasional cues required during ADLs. Required Braces or Orthoses: Spinal Brace Spinal Brace: Lumbar corset Restrictions Weight Bearing Restrictions: No      Mobility Bed Mobility               General bed mobility comments: Patient seated in recliner upon entry.    Transfers Overall transfer level: Modified independent Equipment used: None             General transfer comment: Sit to stand x3 trials from EOB and low recliner with Mod I. No AD.    Balance  Overall balance assessment: No apparent balance deficits (not formally assessed)                                         ADL either performed or assessed with clinical judgement   ADL Overall ADL's : Modified independent                                             Vision   Vision Assessment?: No apparent visual deficits     Perception     Praxis      Pertinent Vitals/Pain Pain Assessment: 0-10 Pain Score: 2  Pain Location: Back (incision) Pain Descriptors / Indicators: Aching;Sore Pain Intervention(s): Monitored during session;Premedicated before session;Repositioned;Limited activity within patient's tolerance     Hand Dominance Right   Extremity/Trunk Assessment Upper Extremity Assessment Upper Extremity Assessment: Overall WFL for tasks assessed   Lower Extremity Assessment Lower Extremity Assessment: Defer to PT evaluation   Cervical / Trunk Assessment Cervical / Trunk Assessment: Other exceptions Cervical / Trunk Exceptions: s/p spinal surgery   Communication Communication Communication: No difficulties   Cognition Arousal/Alertness: Awake/alert Behavior During Therapy: WFL for tasks assessed/performed Overall Cognitive Status: Within Functional Limits for tasks assessed  General Comments  Clean, dry dressing at incision.    Exercises     Shoulder Instructions      Home Living Family/patient expects to be discharged to:: Private residence Living Arrangements: Spouse/significant other Available Help at Discharge: Family;Available 24 hours/day (Wife only able to proivde supervision. Cannot provide physical assist.) Type of Home: House Home Access: Stairs to enter [employer] of Steps: 3 from garage Entrance Stairs-Rails: Left Home Layout: Two level;Able to live on main level with bedroom/bathroom Alternate Level Stairs-Number of Steps: Full flight of steps  to bonus room on 2nd level. Full flight down to basement.   Bathroom Shower/Tub: Occupational psychologist: Standard     Home Equipment: Hand held shower head;Grab bars - tub/shower;Shower seat - built in;Shower seat;Cane - single point (wife has RW and rollator)          Prior Functioning/Environment Level of Independence: Independent                 OT Problem List: Pain      OT Treatment/Interventions:      OT Goals(Current goals can be found in the care plan section) Acute Rehab OT Goals Patient Stated Goal: To return home. OT Goal Formulation: With patient  OT Frequency:     Barriers to D/C:            Co-evaluation              AM-PAC OT "6 Clicks" Daily Activity     Outcome Measure Help from another person eating meals?: None Help from another person taking care of personal grooming?: None Help from another person toileting, which includes using toliet, bedpan, or urinal?: None Help from another person bathing (including washing, rinsing, drying)?: A Little Help from another person to put on and taking off regular upper body clothing?: None Help from another person to put on and taking off regular lower body clothing?: None 6 Click Score: 23   End of Session Equipment Utilized During Treatment: Back brace  Activity Tolerance: Patient tolerated treatment well Patient left: in chair;with call bell/phone within reach  OT Visit Diagnosis: Pain Pain - Right/Left:  (bilateral) Pain - part of body:  (Back (incision))                Time: 7915-0569 OT Time Calculation (min): 24 min Charges:  OT General Charges $OT Visit: 1 Visit OT Evaluation $OT Eval Low Complexity: 1 Low OT Treatments $Self Care/Home Management : 8-22 mins  Betania Dizon H. OTR/L Supplemental OT, Department of rehab services [phone]  Tamaria Dunleavy R H. 05/16/2020, 10:08 AM

## 2020-05-19 ENCOUNTER — Other Ambulatory Visit: Payer: Self-pay | Admitting: *Deleted

## 2020-05-19 ENCOUNTER — Other Ambulatory Visit: Payer: Medicare HMO | Admitting: *Deleted

## 2020-05-19 NOTE — Patient Outreach (Signed)
[facility]) Care Management  05/19/2020  Ricardo [name] 07/13/1940 [id number]   EMMI-GENERAL DISCHARGE-SUCCESSFUL-RESOLVED RED ON EMMI ALERT Day #1 Date: 05/18/2020 Red Alert Reason: NO FOLLOW UP APPOINTMENT  OUTREACH #1 RN received a call from the pt regarding the above emmis. Pt verified issues are resolved and he has a follow up appointment that has been arranged recently. No additional issues at this time.  Will closed with no additional issues at this time.  Raina Mina, RN Care Management Coordinator [name] Office [phone]

## 2020-05-19 NOTE — Patient Outreach (Signed)
[facility]) Care Management  05/19/2020  ARCH METHOT 1941-02-02 [id number]   EMMI-GENERAL DISCHARGE-UNSUCCESSFUL RED ON EMMI ALERT Day #1 Date:05/18/2020 Red Alert Reason: NO FOLLOW UP APPOINTMENT  OUTREACH #1 RN attempted outreach call today however unsuccessful. RN able to leave a HIPAA approved voice message requesting a call back.  Will send outreach letter and scheduled another follow up call over the next week.  Raina Mina, RN Care Management Coordinator [place] Office [phone]

## 2020-05-20 NOTE — Discharge Summary (Signed)
Physician Discharge Summary  Patient ID: Ricardo [name] MRN: [id number] DOB/AGE: [birth year] 78 y.o.  Admit date: 05/15/2020 Discharge date: 05/16/2020 Admission Diagnoses: Spondylolisthesis L4-L5 with chronic radiculopathy, neurogenic claudication.  Discharge Diagnoses: Spondylolisthesis L4-L5 with chronic radiculopathy, neurogenic claudication. Active Problems:   Spondylolisthesis at L4-L5 level   Discharged Condition: good  Hospital Course: Patient was admitted to undergo surgical decompression stabilization which he tolerated well.  Consults: None  Significant Diagnostic Studies: None  Treatments: surgery: Decompression fusion L4-L5  Discharge Exam: Blood pressure 113/64, pulse 79, temperature 98 F (36.7 C), temperature source Oral, resp. rate 16, height 5\' 11"  (1.803 m), weight 101.1 kg, SpO2 97 %. Incision is clean and dry Station and gait are intact  Disposition: Discharge disposition: 01-Home or Self Care       Discharge Instructions    Call MD for:  redness, tenderness, or signs of infection (pain, swelling, redness, odor or green/yellow discharge around incision site)   Complete by: As directed    Call MD for:  severe uncontrolled pain   Complete by: As directed    Call MD for:  temperature >100.4   Complete by: As directed    Diet - low sodium heart healthy   Complete by: As directed    Diet general   Complete by: As directed    Discharge wound care:   Complete by: As directed    Okay to shower. Do not apply salves or appointments to incision. No heavy lifting with the upper extremities greater than 10 pounds. May resume driving when not requiring pain medication and patient feels comfortable with doing so.   Incentive spirometry RT   Complete by: As directed    Increase activity slowly   Complete by: As directed      Allergies as of 05/16/2020   No Known Allergies     Medication List    TAKE these medications   acetaminophen 650 MG CR  tablet Commonly known as: TYLENOL Take 650 mg by mouth every 8 (eight) hours.   aspirin 81 MG tablet Take 81 mg by mouth daily. Reported on 07/07/2015   cholecalciferol 25 MCG (1000 UNIT) tablet Commonly known as: VITAMIN D3 Take 1,000 Units by mouth daily.   escitalopram 10 MG tablet Commonly known as: LEXAPRO Take 10 mg by mouth daily.   irbesartan-hydrochlorothiazide 150-12.5 MG tablet Commonly known as: AVALIDE Take 1 tablet by mouth daily.   metFORMIN 500 MG 24 hr tablet Commonly known as: GLUCOPHAGE-XR Take 500 mg by mouth daily.   methocarbamol 500 MG tablet Commonly known as: ROBAXIN Take 1 tablet (500 mg total) by mouth every 6 (six) hours as needed for muscle spasms.   multivitamin tablet Take 1 tablet by mouth daily.   Naproxen Sodium 220 MG Caps Take 220 mg by mouth in the morning and at bedtime.   oxyCODONE-acetaminophen 5-325 MG tablet Commonly known as: PERCOCET/ROXICET Take 1-2 tablets by mouth every 4 (four) hours as needed for moderate pain or severe pain.   Vitamin C 500 MG Caps Take 500 mg by mouth daily.            Discharge Care Instructions  (From admission, onward)         Start     Ordered   05/16/20 0000  Discharge wound care:       Comments: Okay to shower. Do not apply salves or appointments to incision. No heavy lifting with the upper extremities greater than 10 pounds. May resume driving when not  requiring pain medication and patient feels comfortable with doing so.   05/16/20 1040          Follow-up Information    Kristeen Miss, MD Follow up.   Specialty: Neurosurgery Contact information: [address] [place] [address] [place] [address] [phone]               Signed: Earleen Newport 05/20/2020, 12:32 PM

## 2020-05-30 DIAGNOSIS — M5416 Radiculopathy, lumbar region: Secondary | ICD-10-CM | POA: Diagnosis not present

## 2020-05-30 DIAGNOSIS — M4316 Spondylolisthesis, lumbar region: Secondary | ICD-10-CM | POA: Diagnosis not present

## 2020-06-10 DIAGNOSIS — G4733 Obstructive sleep apnea (adult) (pediatric): Secondary | ICD-10-CM | POA: Diagnosis not present

## 2020-06-18 DIAGNOSIS — Z961 Presence of intraocular lens: Secondary | ICD-10-CM | POA: Diagnosis not present

## 2020-06-18 DIAGNOSIS — H524 Presbyopia: Secondary | ICD-10-CM | POA: Diagnosis not present

## 2020-06-18 DIAGNOSIS — H52202 Unspecified astigmatism, left eye: Secondary | ICD-10-CM | POA: Diagnosis not present

## 2020-06-27 DIAGNOSIS — I10 Essential (primary) hypertension: Secondary | ICD-10-CM | POA: Diagnosis not present

## 2020-06-27 DIAGNOSIS — Z6831 Body mass index (BMI) 31.0-31.9, adult: Secondary | ICD-10-CM | POA: Diagnosis not present

## 2020-06-27 DIAGNOSIS — M4316 Spondylolisthesis, lumbar region: Secondary | ICD-10-CM | POA: Diagnosis not present

## 2020-07-25 DIAGNOSIS — Z6831 Body mass index (BMI) 31.0-31.9, adult: Secondary | ICD-10-CM | POA: Diagnosis not present

## 2020-07-25 DIAGNOSIS — I10 Essential (primary) hypertension: Secondary | ICD-10-CM | POA: Diagnosis not present

## 2020-07-25 DIAGNOSIS — I739 Peripheral vascular disease, unspecified: Secondary | ICD-10-CM | POA: Diagnosis not present

## 2020-07-25 DIAGNOSIS — G4733 Obstructive sleep apnea (adult) (pediatric): Secondary | ICD-10-CM | POA: Diagnosis not present

## 2020-07-25 DIAGNOSIS — E785 Hyperlipidemia, unspecified: Secondary | ICD-10-CM | POA: Diagnosis not present

## 2020-07-25 DIAGNOSIS — E1151 Type 2 diabetes mellitus with diabetic peripheral angiopathy without gangrene: Secondary | ICD-10-CM | POA: Diagnosis not present

## 2020-08-07 DIAGNOSIS — L820 Inflamed seborrheic keratosis: Secondary | ICD-10-CM | POA: Diagnosis not present

## 2020-08-07 DIAGNOSIS — D225 Melanocytic nevi of trunk: Secondary | ICD-10-CM | POA: Diagnosis not present

## 2020-08-07 DIAGNOSIS — L570 Actinic keratosis: Secondary | ICD-10-CM | POA: Diagnosis not present

## 2020-08-07 DIAGNOSIS — L821 Other seborrheic keratosis: Secondary | ICD-10-CM | POA: Diagnosis not present

## 2020-08-07 DIAGNOSIS — Z85820 Personal history of malignant melanoma of skin: Secondary | ICD-10-CM | POA: Diagnosis not present

## 2020-08-07 DIAGNOSIS — Z85828 Personal history of other malignant neoplasm of skin: Secondary | ICD-10-CM | POA: Diagnosis not present

## 2020-08-07 DIAGNOSIS — D2272 Melanocytic nevi of left lower limb, including hip: Secondary | ICD-10-CM | POA: Diagnosis not present

## 2020-08-07 DIAGNOSIS — D2271 Melanocytic nevi of right lower limb, including hip: Secondary | ICD-10-CM | POA: Diagnosis not present

## 2020-09-03 DIAGNOSIS — M5416 Radiculopathy, lumbar region: Secondary | ICD-10-CM | POA: Diagnosis not present

## 2020-09-03 DIAGNOSIS — Z6831 Body mass index (BMI) 31.0-31.9, adult: Secondary | ICD-10-CM | POA: Diagnosis not present

## 2020-09-03 DIAGNOSIS — I10 Essential (primary) hypertension: Secondary | ICD-10-CM | POA: Diagnosis not present

## 2020-09-22 DIAGNOSIS — M5416 Radiculopathy, lumbar region: Secondary | ICD-10-CM | POA: Diagnosis not present

## 2020-09-22 DIAGNOSIS — M5117 Intervertebral disc disorders with radiculopathy, lumbosacral region: Secondary | ICD-10-CM | POA: Diagnosis not present

## 2020-11-03 ENCOUNTER — Other Ambulatory Visit: Payer: Self-pay | Admitting: Cardiovascular Disease

## 2020-11-06 ENCOUNTER — Ambulatory Visit: Payer: Medicare HMO | Admitting: Emergency Medicine

## 2020-11-06 ENCOUNTER — Other Ambulatory Visit: Payer: Self-pay

## 2020-11-06 ENCOUNTER — Encounter: Payer: Self-pay | Admitting: Emergency Medicine

## 2020-11-06 DIAGNOSIS — G4733 Obstructive sleep apnea (adult) (pediatric): Secondary | ICD-10-CM

## 2020-11-06 NOTE — Assessment & Plan Note (Signed)
Please continue to use your CPAP every night as you have been doing.  We reviewed your compliance report today and your usage is excellent.  Let us know if he has any problems with your equipment or problems getting your supplies. We briefly discussed the hypoglossal stimulator which is sometimes used for sleep apnea.  If you would like to learn more about this I can refer you to one of our sleep specialist here in the office.  Let me know. Follow Dr. Lamonte Sakai in 1 year or sooner if any problems.

## 2020-11-06 NOTE — Patient Instructions (Signed)
Please continue to use your CPAP every night as you have been doing.  We reviewed your compliance report today and your usage is excellent.  Let us know if he has any problems with your equipment or problems getting your supplies. We briefly discussed the hypoglossal stimulator which is sometimes used for sleep apnea.  If you would like to learn more about this I can refer you to one of our sleep specialist here in the office.  Let me know. Follow Dr. Lamonte Sakai in 1 year or sooner if any problems.

## 2020-11-06 NOTE — Progress Notes (Deleted)
$'@Patient'K$  ID: Ricardo [name], male    DOB: 1940-04-27, 80 y.o.   MRN: [id number]  Chief Complaint  Patient presents with   Follow-up    No c/o      Referring provider: Leanna Battles, MD  HPI:  80 year old male former smoker followed in our office for moderate obstructive sleep apnea, managed on CPAP.  Previously tried on oral appliance but did not tolerate as it altered his bite.  PMH: Allergic rhinitis, cough Smoker/ Smoking History: Former smoker Maintenance: None Pt of: Dr. Lamonte Sakai  11/06/2020  - Visit   80 year old male former smoker presenting to office today as a CPAP follow-up.  He is completed 1 year follow-up with our office today.  He has no acute respiratory complaints.  He reports he is doing well on CPAP therapy.  CPAP compliance report listed below:  10/02/2019-10/31/2019-30 had a last 30 days use, all 30 those days greater than 4 hours, average usage 8 hours and 19 minutes, CPAP set pressure of 9, AHI 1.7  Patient has occasional dry mouth when using CPAP therapy.  He is using a nasal mask.  He has not yet tried to adjust his humidity.  We will discuss this today.  Questionaires / Pulmonary Flowsheets:   ACT:  No flowsheet data found.  MMRC: No flowsheet data found.  Epworth:  No flowsheet data found.  Tests:   03/19/2015-home sleep study- AHI 19.7-hour, SaO2 low 80%   FENO:  No results found for: NITRICOXIDE  PFT: No flowsheet data found.  WALK:  No flowsheet data found.  Imaging: No results found.  Lab Results:  CBC    Component Value Date/Time   WBC 10.8 (H) 05/16/2020 0444   RBC 3.78 (L) 05/16/2020 0444   HGB 11.6 (L) 05/16/2020 0444   HCT 33.8 (L) 05/16/2020 0444   PLT 174 05/16/2020 0444   MCV 89.4 05/16/2020 0444   MCH 30.7 05/16/2020 0444   MCHC 34.3 05/16/2020 0444   RDW 13.1 05/16/2020 0444    BMET    Component Value Date/Time   NA 139 05/16/2020 0444   K 4.1 05/16/2020 0444   CL 104 05/16/2020 0444   CO2 25  05/16/2020 0444   GLUCOSE 160 (H) 05/16/2020 0444   BUN 22 05/16/2020 0444   CREATININE 1.15 05/16/2020 0444   CALCIUM 8.5 (L) 05/16/2020 0444   GFRNONAA >60 05/16/2020 0444    BNP No results found for: BNP  ProBNP No results found for: PROBNP  Specialty Problems       Pulmonary Problems   Allergic rhinitis    Qualifier: Diagnosis of  By: [name] RN, [name]        Obstructive sleep apnea    03/19/2015-home sleep study- AHI 19.7-hour, SaO2 low 80%        Cough    No Known Allergies  Immunization History  Administered Date(s) Administered   Influenza, High Dose Seasonal PF 03/15/2016   Influenza,inj,Quad PF,6+ Mos 12/04/2014   Influenza-Unspecified 12/04/2017, 11/02/2019   PFIZER(Purple Top)SARS-COV-2 Vaccination 04/20/2019, 05/21/2019    Past Medical History:  Diagnosis Date   Allergic rhinitis 05/24/2007   Qualifier: Diagnosis of  By: [name] RN, [name]     Chest pain    Cough 07/13/2016   Depression    Diabetes mellitus without complication ([place])    Dyspnea    Dysrhythmia    Falls frequently    Hemorrhoids    History of pneumonia    Hx of colonic polyps 10/31/2002   Hyperplastic  Hypertension    Leg cramps    Numbness and tingling in both hands    Obstructive sleep apnea    CPAP   Peptic ulcer    Syncope    Tachycardia     Tobacco History: Social History   Tobacco Use  Smoking Status Former   Types: Pipe   Quit date: 04/05/1988   Years since quitting: 32.6  Smokeless Tobacco Never   Counseling given: Not Answered   Continue to not smoke  Outpatient Encounter Medications as of 11/06/2020  Medication Sig   Ascorbic Acid (VITAMIN C) 500 MG CAPS Take 500 mg by mouth daily.   aspirin 81 MG tablet Take 81 mg by mouth daily. Reported on 07/07/2015   cholecalciferol (VITAMIN D3) 25 MCG (1000 UNIT) tablet Take 2,000 Units by mouth daily.   escitalopram (LEXAPRO) 10 MG tablet Take 10 mg by mouth daily.   irbesartan-hydrochlorothiazide (AVALIDE)  150-12.5 MG tablet Take 1 tablet by mouth daily.   metFORMIN (GLUCOPHAGE-XR) 500 MG 24 hr tablet Take 500 mg by mouth daily.   Multiple Vitamin (MULTIVITAMIN) tablet Take 1 tablet by mouth daily.   Naproxen Sodium 220 MG CAPS Take 220 mg by mouth in the morning and at bedtime.   acetaminophen (TYLENOL) 650 MG CR tablet Take 650 mg by mouth every 8 (eight) hours. (Patient not taking: Reported on 11/06/2020)   methocarbamol (ROBAXIN) 500 MG tablet Take 1 tablet (500 mg total) by mouth every 6 (six) hours as needed for muscle spasms. (Patient not taking: Reported on 11/06/2020)   oxyCODONE-acetaminophen (PERCOCET/ROXICET) 5-325 MG tablet Take 1-2 tablets by mouth every 4 (four) hours as needed for moderate pain or severe pain. (Patient not taking: Reported on 11/06/2020)   No facility-administered encounter medications on file as of 11/06/2020.     Review of Systems  Review of Systems  Constitutional:  Negative for activity change, chills, fatigue, fever and unexpected weight change.  HENT:  Negative for postnasal drip, rhinorrhea, sinus pressure, sinus pain and sore throat.   Eyes: Negative.   Respiratory:  Negative for cough, shortness of breath and wheezing.   Cardiovascular:  Negative for chest pain and palpitations.  Gastrointestinal:  Negative for constipation, diarrhea, nausea and vomiting.  Endocrine: Negative.   Genitourinary: Negative.   Musculoskeletal: Negative.   Skin: Negative.   Neurological:  Negative for dizziness and headaches.  Psychiatric/Behavioral: Negative.  Negative for dysphoric mood. The patient is not nervous/anxious.   All other systems reviewed and are negative.   Physical Exam  BP 120/72 (BP Location: Right Arm, Patient Position: Sitting, Cuff Size: Normal)   Pulse 66   Temp 98.4 F (36.9 C) (Oral)   Ht '5\' 10"'$  (1.778 m)   Wt 217 lb 12.8 oz (98.8 kg)   SpO2 94%   BMI 31.25 kg/m   Wt Readings from Last 5 Encounters:  11/06/20 217 lb 12.8 oz (98.8 kg)   05/15/20 222 lb 12.8 oz (101.1 kg)  05/13/20 222 lb 12.8 oz (101.1 kg)  11/06/19 230 lb (104.3 kg)  11/02/19 (!) 227 lb (103 kg)    BMI Readings from Last 5 Encounters:  11/06/20 31.25 kg/m  05/15/20 31.07 kg/m  05/13/20 31.07 kg/m  11/06/19 33.00 kg/m  11/02/19 32.57 kg/m     Physical Exam Vitals and nursing note reviewed.  Constitutional:      General: He is not in acute distress.    Appearance: Normal appearance. He is normal weight.  HENT:     Head: Normocephalic  and atraumatic.     Right Ear: Hearing and external ear normal.     Left Ear: Hearing and external ear normal.     Nose: No mucosal edema.     Right Turbinates: Not enlarged.     Left Turbinates: Not enlarged.     Mouth/Throat:     Mouth: Mucous membranes are dry.     Pharynx: Oropharynx is clear. No oropharyngeal exudate.  Eyes:     Pupils: Pupils are equal, round, and reactive to light.  Cardiovascular:     Rate and Rhythm: Normal rate and regular rhythm.     Pulses: Normal pulses.     Heart sounds: Normal heart sounds. No murmur heard. Pulmonary:     Effort: Pulmonary effort is normal.     Breath sounds: Normal breath sounds. No decreased breath sounds, wheezing or rales.  Musculoskeletal:     Cervical back: Normal range of motion.     Right lower leg: No edema.     Left lower leg: No edema.  Lymphadenopathy:     Cervical: No cervical adenopathy.  Skin:    General: Skin is warm and dry.     Capillary Refill: Capillary refill takes less than 2 seconds.     Findings: No erythema or rash.  Neurological:     General: No focal deficit present.     Mental Status: He is alert and oriented to person, place, and time.     Motor: No weakness.     Coordination: Coordination normal.     Gait: Gait is intact. Gait normal.  Psychiatric:        Mood and Affect: Mood normal.        Behavior: Behavior normal. Behavior is cooperative.        Thought Content: Thought content normal.        Judgment:  Judgment normal.      Assessment & Plan:   No problem-specific Assessment & Plan notes found for this encounter.    No follow-ups on file.   Collene Gobble, MD 11/06/2020   This appointment required 23 minutes of patient care (this includes precharting, chart review, review of results, face-to-face care, etc.).

## 2020-11-06 NOTE — Progress Notes (Signed)
Subjective:    Patient ID: Ricardo [name], male    DOB: [birth year], 80 y.o.   MRN: [id number]  Cough   ROV 07/13/17 --patient has a history of obstructive sleep apnea, former tobacco use.  Also probably allergic rhinitis with some associated cough.  He has been compliant with CPAP 9 cmH2O.  He uses a nasal mask. His machine had to be replaced at the beginning of the year. He had braces removed a month ago. He is well rested, good energy. May snore a little with the mask on. He has great compliance. No accidental naps, occasionally intentional naps. No falling asleep in inappropriate situations.  He is now on Vit D3, Magnesium, metformin. No real mucous or cough right now.   Compliance data 06/13/17- 07/12/17 > uses CPAP > 4h a night 100% of nights, minimal leak, AHI 2.2.   ROV 11/06/20 --follow-up visit for 80 year old gentleman with a history of former tobacco use and obstructive sleep apnea.  He was formally on an oral appliance to control his OSA but subsequently changed to CPAP.  Currently on 9 cmH2O.  He reports today that he is doing well. His sleep quality is much better with the CPAP. Less daytime sleepiness - does not nap, no unintended sleep.  CPAP compliance report is available for 30 days which shows 100% usage for greater than 4 hours.  Minimal leak, AHI 1.3/h. He has his supplies, provided by [employer].   Review of Systems  Respiratory:  Negative for cough.    Past Medical History:  Diagnosis Date   Allergic rhinitis 05/24/2007   Qualifier: Diagnosis of  By: [name] RN, [name]     Chest pain    Cough 07/13/2016   Depression    Diabetes mellitus without complication ([place])    Dyspnea    Dysrhythmia    Falls frequently    Hemorrhoids    History of pneumonia    Hx of colonic polyps 10/31/2002   Hyperplastic    Hypertension    Leg cramps    Numbness and tingling in both hands    Obstructive sleep apnea    CPAP   Peptic ulcer    Syncope    Tachycardia      Family History   Problem Relation Age of Onset   Stroke Father    Hypertension Mother    Pancreatic cancer Mother    Anuerysm Brother        AAA   Anuerysm Father        AAA     Social History   Socioeconomic History   Marital status: Married    Spouse name: Not on file   Number of children: 3   Years of education: Not on file   Highest education level: Not on file  Occupational History   Occupation: Lobbyist: [employer]  Tobacco Use   Smoking status: Former    Types: Pipe    Quit date: 04/05/1988    Years since quitting: 32.6   Smokeless tobacco: Never  Substance and Sexual Activity   Alcohol use: Yes    Comment: occasionally   Drug use: No   Sexual activity: Not on file  Other Topics Concern   Not on file  Social History Narrative   Not on file   Social Determinants of Health   Financial Resource Strain: Not on file  Food Insecurity: Not on file  Transportation Needs: Not on file  Physical Activity: Not on file  Stress: Not on file  Social Connections: Not on file  Intimate Partner Violence: Not on file     No Known Allergies   Outpatient Medications Prior to Visit  Medication Sig Dispense Refill   Ascorbic Acid (VITAMIN C) 500 MG CAPS Take 500 mg by mouth daily.     aspirin 81 MG tablet Take 81 mg by mouth daily. Reported on 07/07/2015     cholecalciferol (VITAMIN D3) 25 MCG (1000 UNIT) tablet Take 2,000 Units by mouth daily.     escitalopram (LEXAPRO) 10 MG tablet Take 10 mg by mouth daily.     irbesartan-hydrochlorothiazide (AVALIDE) 150-12.5 MG tablet Take 1 tablet by mouth daily.     metFORMIN (GLUCOPHAGE-XR) 500 MG 24 hr tablet Take 500 mg by mouth daily.     Multiple Vitamin (MULTIVITAMIN) tablet Take 1 tablet by mouth daily.     Naproxen Sodium 220 MG CAPS Take 220 mg by mouth in the morning and at bedtime.     acetaminophen (TYLENOL) 650 MG CR tablet Take 650 mg by mouth every 8 (eight) hours. (Patient not taking: Reported on 11/06/2020)      methocarbamol (ROBAXIN) 500 MG tablet Take 1 tablet (500 mg total) by mouth every 6 (six) hours as needed for muscle spasms. (Patient not taking: Reported on 11/06/2020) 40 tablet 3   oxyCODONE-acetaminophen (PERCOCET/ROXICET) 5-325 MG tablet Take 1-2 tablets by mouth every 4 (four) hours as needed for moderate pain or severe pain. (Patient not taking: Reported on 11/06/2020) 50 tablet 0   No facility-administered medications prior to visit.         Objective:   Physical Exam Vitals:   11/06/20 1611  BP: 120/72  Pulse: 66  Temp: 98.4 F (36.9 C)  TempSrc: Oral  SpO2: 94%  Weight: 217 lb 12.8 oz (98.8 kg)  Height: '5\' 10"'$  (1.778 m)   Gen: Pleasant, well-nourished, in no distress,  normal affect  ENT: No lesions,  mouth clear,  oropharynx clear, no postnasal drip  Neck: No JVD, no stridor  Lungs: No use of accessory muscles,  clear without rales or rhonchi  Cardiovascular: RRR, heart sounds normal, no murmur or gallops, no peripheral edema  Musculoskeletal: No deformities, no cyanosis or clubbing  Neuro: alert, non focal  Skin: Warm, no lesions or rashes       Assessment & Plan:  Obstructive sleep apnea Please continue to use your CPAP every night as you have been doing.  We reviewed your compliance report today and your usage is excellent.  Let us know if he has any problems with your equipment or problems getting your supplies. We briefly discussed the hypoglossal stimulator which is sometimes used for sleep apnea.  If you would like to learn more about this I can refer you to one of our sleep specialist here in the office.  Let me know. Follow Dr. Lamonte Sakai in 1 year or sooner if any problems.  Baltazar Apo, MD, PhD 11/06/2020, 5:06 PM [facility] [phone] or if no answer [phone]

## 2020-11-16 ENCOUNTER — Other Ambulatory Visit: Payer: Self-pay | Admitting: Cardiovascular Disease

## 2020-11-19 ENCOUNTER — Other Ambulatory Visit: Payer: Self-pay | Admitting: Cardiovascular Disease

## 2020-12-31 DIAGNOSIS — I10 Essential (primary) hypertension: Secondary | ICD-10-CM | POA: Diagnosis not present

## 2020-12-31 DIAGNOSIS — Z6830 Body mass index (BMI) 30.0-30.9, adult: Secondary | ICD-10-CM | POA: Diagnosis not present

## 2020-12-31 DIAGNOSIS — M5416 Radiculopathy, lumbar region: Secondary | ICD-10-CM | POA: Diagnosis not present

## 2021-01-03 IMAGING — US US AORTA
1 series · 10 of 10 positions shown · non-contrast
Comparison: 09/20/2011

CLINICAL DATA: Essential hypertension, family history of abdominal
aortic aneurysm

EXAM:
ULTRASOUND OF ABDOMINAL AORTA
TECHNIQUE: Ultrasound examination of the abdominal aorta and proximal common
iliac arteries was performed to evaluate for aneurysm. Additional
color and Doppler images of the distal aorta were obtained to
document patency.

[Series 1: us aorta · 0.31mm/px · 10 of 10 slices shown]
[im 1/10]
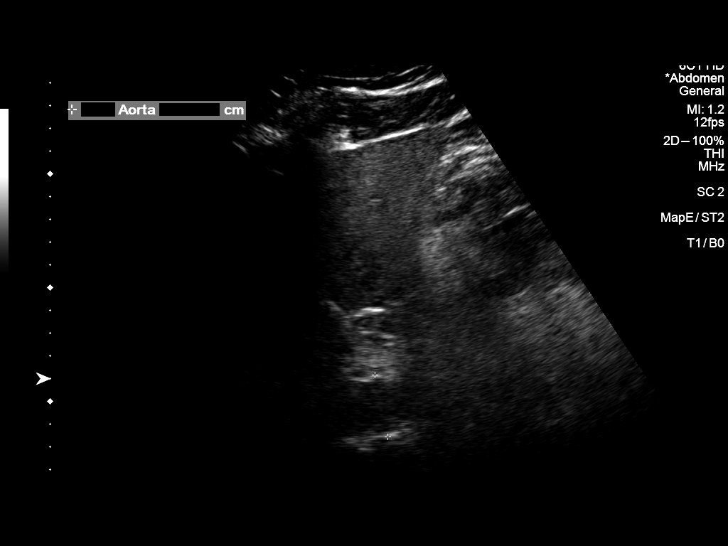
[im 2/10]
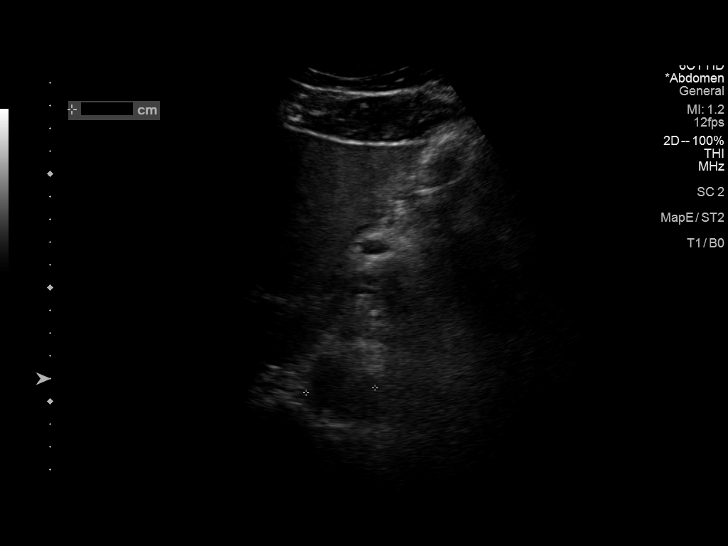
[im 3/10]
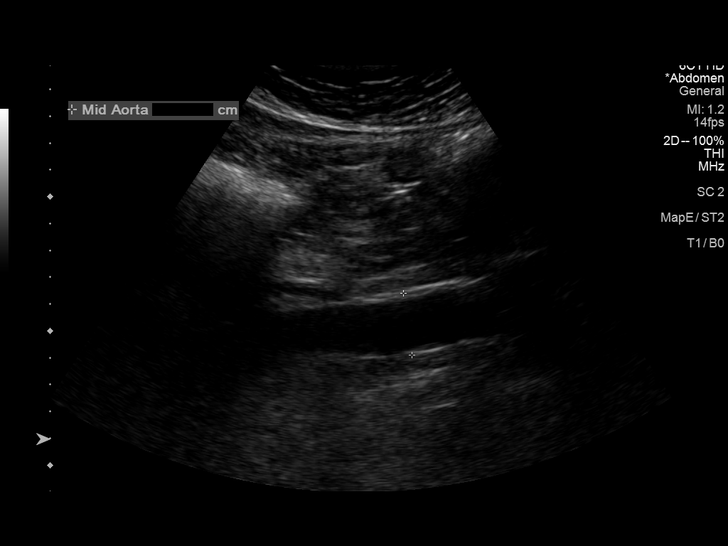
[im 4/10]
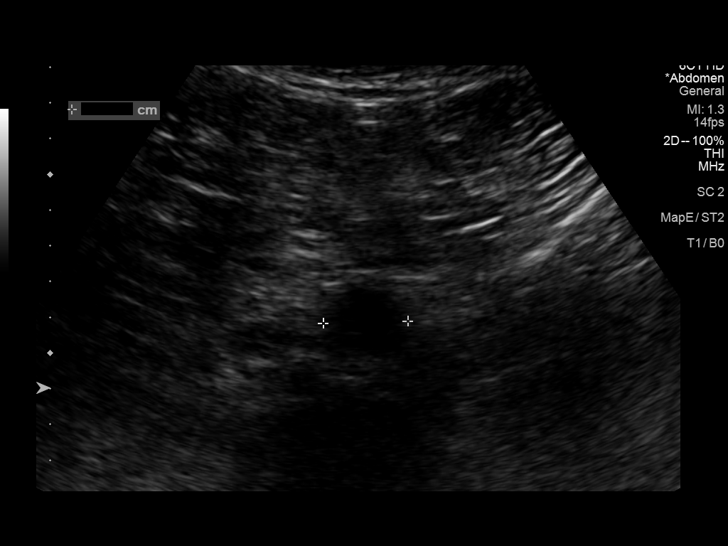
[im 5/10]
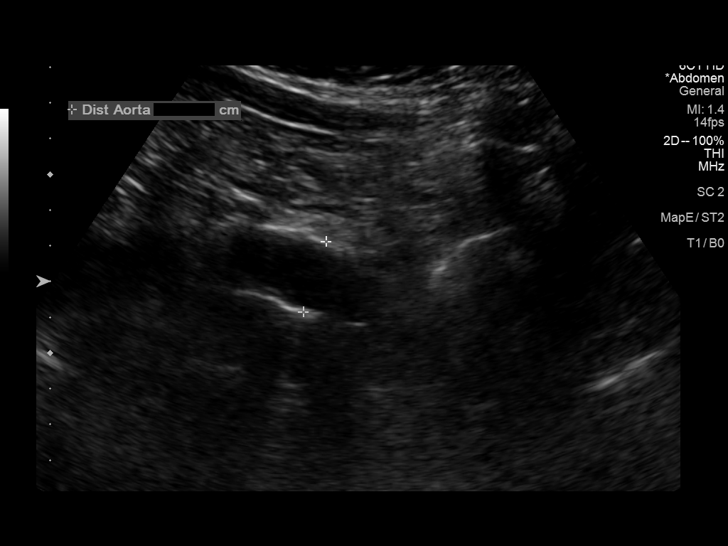
[im 6/10]
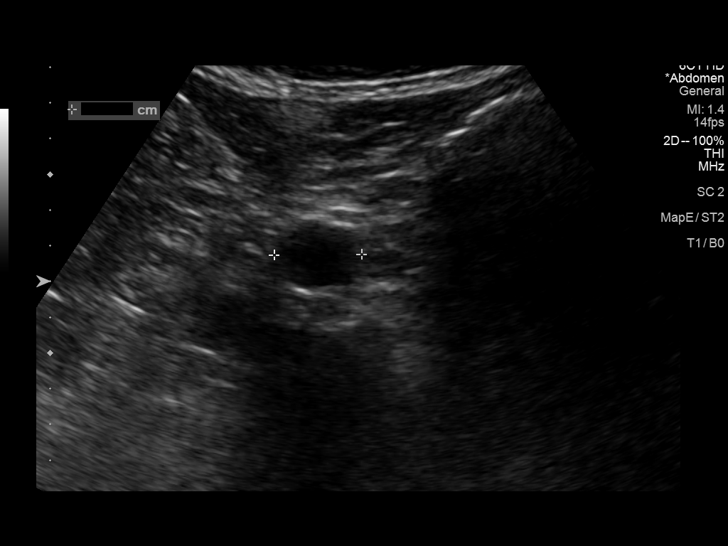
[im 7/10]
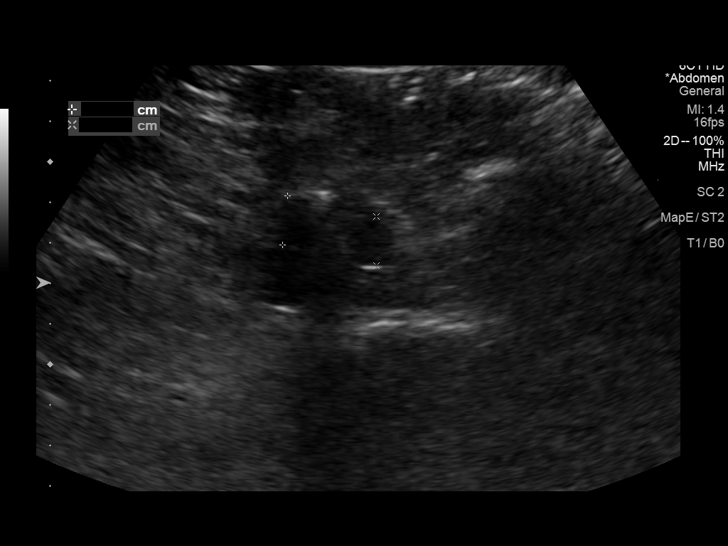
[im 8/10]
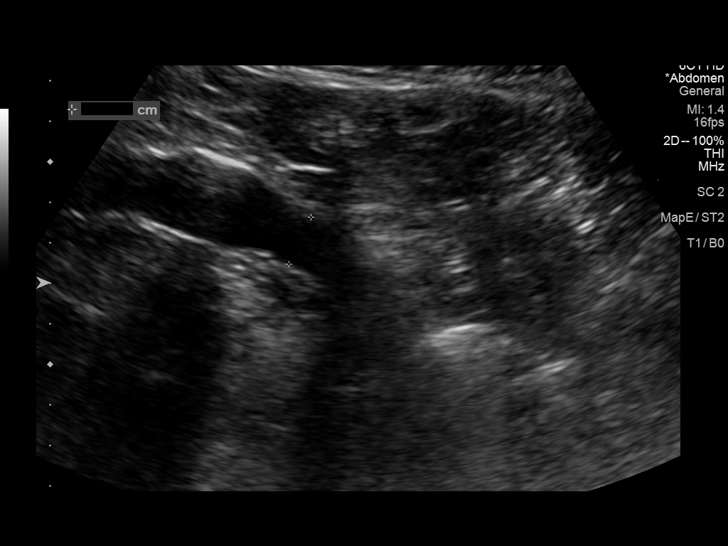
[im 9/10]
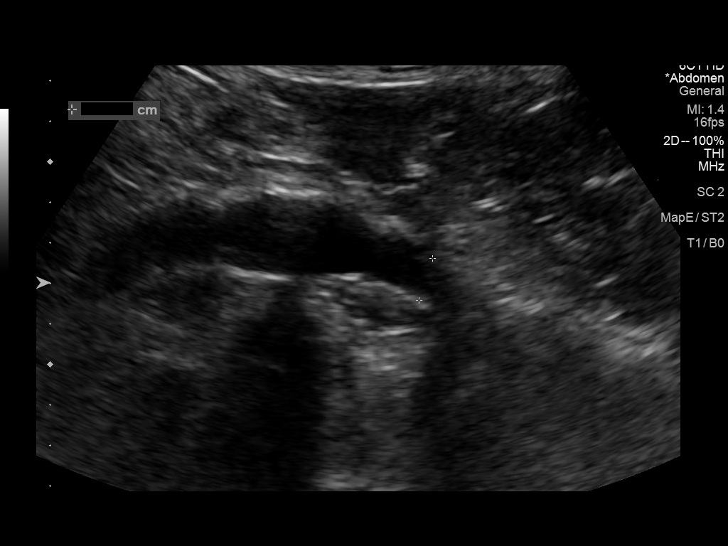
[im 10/10]
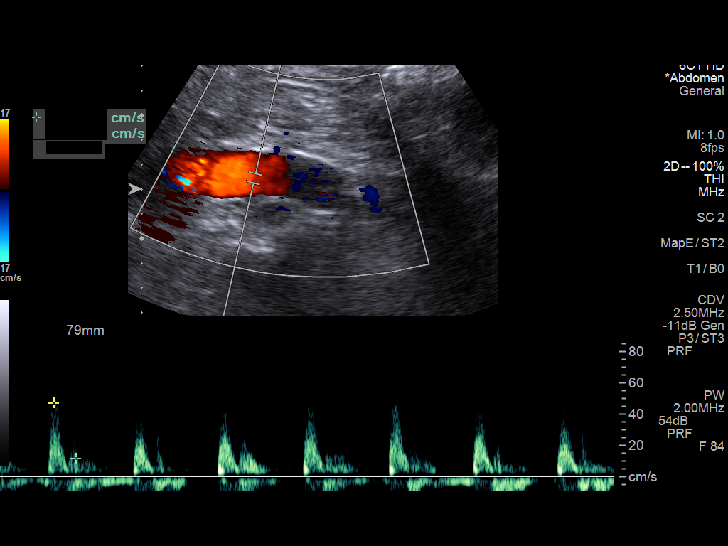

[10 of 10 positions shown; findings below may reference images not displayed]

FINDINGS: Abdominal aortic measurements as follows:

Proximal:  2.8 x 3.0 cm

Mid:  2.3 x 2.4 cm

Distal:  2.1 x 2.5 cm
Patent: Yes, peak systolic velocity is 47.3 cm/s

Minimal atherosclerotic changes abdominal aorta.

Right common iliac artery: 13 x 12 cm

Left common iliac artery: 11 x 12 cm
IMPRESSION: Normal caliber of abdominal aorta.

## 2021-01-08 DIAGNOSIS — M5416 Radiculopathy, lumbar region: Secondary | ICD-10-CM | POA: Diagnosis not present

## 2021-01-08 DIAGNOSIS — M4802 Spinal stenosis, cervical region: Secondary | ICD-10-CM | POA: Diagnosis not present

## 2021-01-12 DIAGNOSIS — H43812 Vitreous degeneration, left eye: Secondary | ICD-10-CM | POA: Diagnosis not present

## 2021-01-16 DIAGNOSIS — G4733 Obstructive sleep apnea (adult) (pediatric): Secondary | ICD-10-CM | POA: Diagnosis not present

## 2021-01-16 DIAGNOSIS — Z6830 Body mass index (BMI) 30.0-30.9, adult: Secondary | ICD-10-CM | POA: Diagnosis not present

## 2021-01-16 DIAGNOSIS — R030 Elevated blood-pressure reading, without diagnosis of hypertension: Secondary | ICD-10-CM | POA: Diagnosis not present

## 2021-01-16 DIAGNOSIS — M5126 Other intervertebral disc displacement, lumbar region: Secondary | ICD-10-CM | POA: Diagnosis not present

## 2021-01-27 DIAGNOSIS — E1151 Type 2 diabetes mellitus with diabetic peripheral angiopathy without gangrene: Secondary | ICD-10-CM | POA: Diagnosis not present

## 2021-01-27 DIAGNOSIS — Z125 Encounter for screening for malignant neoplasm of prostate: Secondary | ICD-10-CM | POA: Diagnosis not present

## 2021-01-27 DIAGNOSIS — E785 Hyperlipidemia, unspecified: Secondary | ICD-10-CM | POA: Diagnosis not present

## 2021-01-27 DIAGNOSIS — I10 Essential (primary) hypertension: Secondary | ICD-10-CM | POA: Diagnosis not present

## 2021-01-28 DIAGNOSIS — M5126 Other intervertebral disc displacement, lumbar region: Secondary | ICD-10-CM | POA: Diagnosis not present

## 2021-02-03 DIAGNOSIS — G4733 Obstructive sleep apnea (adult) (pediatric): Secondary | ICD-10-CM | POA: Diagnosis not present

## 2021-02-03 DIAGNOSIS — I739 Peripheral vascular disease, unspecified: Secondary | ICD-10-CM | POA: Diagnosis not present

## 2021-02-03 DIAGNOSIS — Z832 Family history of diseases of the blood and blood-forming organs and certain disorders involving the immune mechanism: Secondary | ICD-10-CM | POA: Diagnosis not present

## 2021-02-03 DIAGNOSIS — E1151 Type 2 diabetes mellitus with diabetic peripheral angiopathy without gangrene: Secondary | ICD-10-CM | POA: Diagnosis not present

## 2021-02-03 DIAGNOSIS — Z0000 Encounter for general adult medical examination without abnormal findings: Secondary | ICD-10-CM | POA: Diagnosis not present

## 2021-02-03 DIAGNOSIS — M545 Low back pain, unspecified: Secondary | ICD-10-CM | POA: Diagnosis not present

## 2021-02-03 DIAGNOSIS — I10 Essential (primary) hypertension: Secondary | ICD-10-CM | POA: Diagnosis not present

## 2021-02-03 DIAGNOSIS — R82998 Other abnormal findings in urine: Secondary | ICD-10-CM | POA: Diagnosis not present

## 2021-02-03 DIAGNOSIS — G8929 Other chronic pain: Secondary | ICD-10-CM | POA: Diagnosis not present

## 2021-02-05 DIAGNOSIS — M5126 Other intervertebral disc displacement, lumbar region: Secondary | ICD-10-CM | POA: Diagnosis not present

## 2021-02-10 DIAGNOSIS — L821 Other seborrheic keratosis: Secondary | ICD-10-CM | POA: Diagnosis not present

## 2021-02-10 DIAGNOSIS — D2272 Melanocytic nevi of left lower limb, including hip: Secondary | ICD-10-CM | POA: Diagnosis not present

## 2021-02-10 DIAGNOSIS — D225 Melanocytic nevi of trunk: Secondary | ICD-10-CM | POA: Diagnosis not present

## 2021-02-10 DIAGNOSIS — Z85820 Personal history of malignant melanoma of skin: Secondary | ICD-10-CM | POA: Diagnosis not present

## 2021-02-10 DIAGNOSIS — L218 Other seborrheic dermatitis: Secondary | ICD-10-CM | POA: Diagnosis not present

## 2021-02-10 DIAGNOSIS — Z85828 Personal history of other malignant neoplasm of skin: Secondary | ICD-10-CM | POA: Diagnosis not present

## 2021-02-10 DIAGNOSIS — L570 Actinic keratosis: Secondary | ICD-10-CM | POA: Diagnosis not present

## 2021-02-12 DIAGNOSIS — M5126 Other intervertebral disc displacement, lumbar region: Secondary | ICD-10-CM | POA: Diagnosis not present

## 2021-02-23 DIAGNOSIS — M5126 Other intervertebral disc displacement, lumbar region: Secondary | ICD-10-CM | POA: Diagnosis not present

## 2021-03-04 DIAGNOSIS — R030 Elevated blood-pressure reading, without diagnosis of hypertension: Secondary | ICD-10-CM | POA: Diagnosis not present

## 2021-03-04 DIAGNOSIS — M5126 Other intervertebral disc displacement, lumbar region: Secondary | ICD-10-CM | POA: Diagnosis not present

## 2021-03-04 DIAGNOSIS — Z6830 Body mass index (BMI) 30.0-30.9, adult: Secondary | ICD-10-CM | POA: Diagnosis not present

## 2021-05-22 DIAGNOSIS — M5126 Other intervertebral disc displacement, lumbar region: Secondary | ICD-10-CM | POA: Diagnosis not present

## 2021-05-22 DIAGNOSIS — Z6829 Body mass index (BMI) 29.0-29.9, adult: Secondary | ICD-10-CM | POA: Diagnosis not present

## 2021-05-22 DIAGNOSIS — R030 Elevated blood-pressure reading, without diagnosis of hypertension: Secondary | ICD-10-CM | POA: Diagnosis not present

## 2021-06-08 DIAGNOSIS — E785 Hyperlipidemia, unspecified: Secondary | ICD-10-CM | POA: Diagnosis not present

## 2021-06-08 DIAGNOSIS — I10 Essential (primary) hypertension: Secondary | ICD-10-CM | POA: Diagnosis not present

## 2021-06-08 DIAGNOSIS — M48061 Spinal stenosis, lumbar region without neurogenic claudication: Secondary | ICD-10-CM | POA: Diagnosis not present

## 2021-06-08 DIAGNOSIS — E1151 Type 2 diabetes mellitus with diabetic peripheral angiopathy without gangrene: Secondary | ICD-10-CM | POA: Diagnosis not present

## 2021-06-08 DIAGNOSIS — G4733 Obstructive sleep apnea (adult) (pediatric): Secondary | ICD-10-CM | POA: Diagnosis not present

## 2021-06-08 DIAGNOSIS — I872 Venous insufficiency (chronic) (peripheral): Secondary | ICD-10-CM | POA: Diagnosis not present

## 2021-06-26 DIAGNOSIS — H5212 Myopia, left eye: Secondary | ICD-10-CM | POA: Diagnosis not present

## 2021-06-26 DIAGNOSIS — Z961 Presence of intraocular lens: Secondary | ICD-10-CM | POA: Diagnosis not present

## 2021-06-26 DIAGNOSIS — D3131 Benign neoplasm of right choroid: Secondary | ICD-10-CM | POA: Diagnosis not present

## 2021-06-26 DIAGNOSIS — E119 Type 2 diabetes mellitus without complications: Secondary | ICD-10-CM | POA: Diagnosis not present

## 2021-07-20 ENCOUNTER — Ambulatory Visit: Payer: Medicare HMO | Admitting: Cardiovascular Disease

## 2021-07-20 ENCOUNTER — Encounter: Payer: Self-pay | Admitting: Cardiovascular Disease

## 2021-07-20 VITALS — BP 124/70 | HR 65 | Ht 70.0 in | Wt 214.4 lb

## 2021-07-20 DIAGNOSIS — I10 Essential (primary) hypertension: Secondary | ICD-10-CM

## 2021-07-20 NOTE — Progress Notes (Signed)
?Cardiology Office Note:   ? ?Date:  07/20/2021  ? ?ID:  Ricardo [name], DOB 06/25/1940, MRN [id number] ? ?PCP:  Donnajean Lopes, MD  ?[facility] Cardiologist:  Fallou Hulbert  ?[place] Electrophysiologist:  None  ? ?Referring MD: Donnajean Lopes, MD  ? ?No chief complaint on file. ? ? ?History of Present Illness:   ? ?Ricardo [name] is a 81 y.o. male with a hx of dyspnea..   I last saw him around 2010. ?We were asked to see him again by Dr. Eola Waldrep Aspen for further evaluation of progressive dyspena ? ?He has been having more shortness of breath and fatigue with exertion .  ?No CP or dyspnea ?Getting some regular exercise,  ?Issues ?- balance is off - some of this issue sounds like orthostatic hypotension .  Most of his balance issues occur after he has been standing .   Difficult for him to walk heel-to-toe ? ?-  Has leg cramps at night .  Relieved with stretching his legs ? ? July 20, 2021 ?[name] is seen for follow up  ?He back surgery a year ago, ?Had surgery but now has some issues with the other leg  ?BP is good tday  ?Dr. Yoseph Haile Aspen added amlodipine  ? ? ?Past Medical History:  ?Diagnosis Date  ? Allergic rhinitis 05/24/2007  ? Qualifier: Diagnosis of  By: [name] RN, Doroteo Bradford    ? Chest pain   ? Cough 07/13/2016  ? Depression   ? Diabetes mellitus without complication ([place])   ? Dyspnea   ? Dysrhythmia   ? Falls frequently   ? Hemorrhoids   ? History of pneumonia   ? Hx of colonic polyps 10/31/2002  ? Hyperplastic   ? Hypertension   ? Leg cramps   ? Numbness and tingling in both hands   ? Obstructive sleep apnea   ? CPAP  ? Peptic ulcer   ? Syncope   ? Tachycardia   ? ? ?Past Surgical History:  ?Procedure Laterality Date  ? APPENDECTOMY  1957  ? ? ?Current Medications: ?Current Meds  ?Medication Sig  ? acetaminophen (TYLENOL) 650 MG CR tablet Take 650 mg by mouth every 8 (eight) hours.  ? Ascorbic Acid (VITAMIN C) 500 MG CAPS Take 500 mg by mouth daily.  ? aspirin 81 MG tablet Take 81 mg by mouth daily. Reported on  07/07/2015  ? cholecalciferol (VITAMIN D3) 25 MCG (1000 UNIT) tablet Take 2,000 Units by mouth daily.  ? escitalopram (LEXAPRO) 10 MG tablet Take 10 mg by mouth daily.  ? irbesartan-hydrochlorothiazide (AVALIDE) 300-12.5 MG tablet Take 1 tablet by mouth daily. Taking a Half tablet twice daily  ? metFORMIN (GLUCOPHAGE-XR) 500 MG 24 hr tablet Take 500 mg by mouth daily.  ? Multiple Vitamin (MULTIVITAMIN) tablet Take 1 tablet by mouth daily.  ? Naproxen Sodium 220 MG CAPS Take 220 mg by mouth in the morning and at bedtime.  ? [DISCONTINUED] irbesartan (AVAPRO) 300 MG tablet Take 300 mg by mouth daily. Taking a half tablet 2 times a day  ?  ? ?Allergies:   Patient has no known allergies.  ? ?Social History  ? ?Socioeconomic History  ? Marital status: Married  ?  Spouse name: Not on file  ? Number of children: 3  ? Years of education: Not on file  ? Highest education level: Not on file  ?Occupational History  ? Occupation: Chief Financial Officer  ?  Employer: [employer]  ?Tobacco Use  ? Smoking status: Former  ?  Types: Pipe  ?  Quit date: 04/05/1988  ?  Years since quitting: 33.3  ? Smokeless tobacco: Never  ?Substance and Sexual Activity  ? Alcohol use: Yes  ?  Comment: occasionally  ? Drug use: No  ? Sexual activity: Not on file  ?Other Topics Concern  ? Not on file  ?Social History Narrative  ? Not on file  ? ?Social Determinants of Health  ? ?Financial Resource Strain: Not on file  ?Food Insecurity: Not on file  ?Transportation Needs: Not on file  ?Physical Activity: Not on file  ?Stress: Not on file  ?Social Connections: Not on file  ?  ? ?Family History: ?The patient's family history includes Anuerysm in his brother and father; Hypertension in his mother; Pancreatic cancer in his mother; Stroke in his father. ? ?ROS:   ?Please see the history of present illness.    ? All other systems reviewed and are negative. ? ?EKGs/Labs/Other Studies Reviewed:   ? ?The following studies were reviewed today: ? ? ? ?Recent Labs: ?No  results found for requested labs within last 8760 hours.  ?Recent Lipid Panel ?No results found for: CHOL, TRIG, HDL, CHOLHDL, VLDL, LDLCALC, LDLDIRECT ? ?Physical Exam:   ? ? ?Physical Exam: ?Blood pressure 124/70, pulse 65, height '5\' 10"'$  (1.778 m), weight 214 lb 6.4 oz (97.3 kg), SpO2 96 %. ? ?GEN:  Well nourished, well developed in no acute distress ?HEENT: Normal ?NECK: No JVD; No carotid bruits ?LYMPHATICS: No lymphadenopathy ?CARDIAC: RRR , no murmurs, rubs, gallops ?RESPIRATORY:  Clear to auscultation without rales, wheezing or rhonchi  ?ABDOMEN: Soft, non-tender, non-distended ?MUSCULOSKELETAL:  No edema; No deformity  ?SKIN: Warm and dry ?NEUROLOGIC:  Alert and oriented x 3 ? ? ?ECG: July 20, 2021: Sinus rhythm with first-degree AV block.  Right bundle branch block previous inferior wall myocardial infarction ? ?Diagnoses:   ? ?1. Essential hypertension   ? ? ?Assessment and Plan:   ? ?1.  HTN:   seems to be tolerating his meds well  ?Cont amlodipine and Irbesartan .  ? ?No further episodes of orthostatic hypotension. ? ?  ? ? ?Medication Adjustments/Labs and Tests Ordered: ?Current medicines are reviewed at length with the patient today.  Concerns regarding medicines are outlined above.  ?Orders Placed This Encounter  ?Procedures  ? EKG 12-Lead  ? ?No orders of the defined types were placed in this encounter. ? ? ?Patient Instructions  ?Medication Instructions:  ?Your physician recommends that you continue on your current medications as directed. Please refer to the Current Medication list given to you today. ? ?*If you need a refill on your cardiac medications before your next appointment, please call your pharmacy* ? ?Lab Work: ?NONE ? ?Testing/Procedures: ?NONE ? ?Follow-Up: ?At [facility], you and your health needs are our priority.  As part of our continuing mission to provide you with exceptional heart care, we have created designated Provider Care Teams.  These Care Teams include your primary  Cardiologist (physician) and Advanced Practice Providers (APPs -  Physician Assistants and Nurse Practitioners) who all work together to provide you with the care you need, when you need it. ? ?We recommend signing up for the patient portal called "MyChart".  Sign up information is provided on this After Visit Summary.  MyChart is used to connect with patients for Virtual Visits (Telemedicine).  Patients are able to view lab/test results, encounter notes, upcoming appointments, etc.  Non-urgent messages can be sent to your provider as well.   ?  To learn more about what you can do with MyChart, go to [email or web address].   ? ?Your next appointment:   ?1 year(s) ? ?The format for your next appointment:   ?In Person ? ?Provider:   ?Robbie Lis, PA-C, Christen Bame, NP, or Richardson Dopp, PA-C     ? ?Other Instructions ?Important Information About Sugar ? ? ? ? ?   ? ?Signed, ?Mertie Moores, MD  ?07/20/2021 4:54 PM    ?[place]  ? ?

## 2021-07-20 NOTE — Patient Instructions (Signed)
Medication Instructions:  ?Your physician recommends that you continue on your current medications as directed. Please refer to the Current Medication list given to you today. ? ?*If you need a refill on your cardiac medications before your next appointment, please call your pharmacy* ? ?Lab Work: ?NONE ? ?Testing/Procedures: ?NONE ? ?Follow-Up: ?At [facility], you and your health needs are our priority.  As part of our continuing mission to provide you with exceptional heart care, we have created designated Provider Care Teams.  These Care Teams include your primary Cardiologist (physician) and Advanced Practice Providers (APPs -  Physician Assistants and Nurse Practitioners) who all work together to provide you with the care you need, when you need it. ? ?We recommend signing up for the patient portal called "MyChart".  Sign up information is provided on this After Visit Summary.  MyChart is used to connect with patients for Virtual Visits (Telemedicine).  Patients are able to view lab/test results, encounter notes, upcoming appointments, etc.  Non-urgent messages can be sent to your provider as well.   ?To learn more about what you can do with MyChart, go to [email or web address].   ? ?Your next appointment:   ?1 year(s) ? ?The format for your next appointment:   ?In Person ? ?Provider:   ?Robbie Lis, PA-C, Christen Bame, NP, or Richardson Dopp, PA-C     ? ?Other Instructions ?Important Information About Sugar ? ? ? ? ?  ?

## 2021-08-11 DIAGNOSIS — D225 Melanocytic nevi of trunk: Secondary | ICD-10-CM | POA: Diagnosis not present

## 2021-08-11 DIAGNOSIS — L570 Actinic keratosis: Secondary | ICD-10-CM | POA: Diagnosis not present

## 2021-08-11 DIAGNOSIS — D2272 Melanocytic nevi of left lower limb, including hip: Secondary | ICD-10-CM | POA: Diagnosis not present

## 2021-08-11 DIAGNOSIS — D2271 Melanocytic nevi of right lower limb, including hip: Secondary | ICD-10-CM | POA: Diagnosis not present

## 2021-08-11 DIAGNOSIS — Z85828 Personal history of other malignant neoplasm of skin: Secondary | ICD-10-CM | POA: Diagnosis not present

## 2021-08-11 DIAGNOSIS — L821 Other seborrheic keratosis: Secondary | ICD-10-CM | POA: Diagnosis not present

## 2021-08-11 DIAGNOSIS — Z85820 Personal history of malignant melanoma of skin: Secondary | ICD-10-CM | POA: Diagnosis not present

## 2021-08-19 DIAGNOSIS — G4733 Obstructive sleep apnea (adult) (pediatric): Secondary | ICD-10-CM | POA: Diagnosis not present

## 2021-09-02 DIAGNOSIS — E785 Hyperlipidemia, unspecified: Secondary | ICD-10-CM | POA: Diagnosis not present

## 2021-09-02 DIAGNOSIS — I10 Essential (primary) hypertension: Secondary | ICD-10-CM | POA: Diagnosis not present

## 2021-09-02 DIAGNOSIS — E1151 Type 2 diabetes mellitus with diabetic peripheral angiopathy without gangrene: Secondary | ICD-10-CM | POA: Diagnosis not present

## 2021-09-19 DIAGNOSIS — G4733 Obstructive sleep apnea (adult) (pediatric): Secondary | ICD-10-CM | POA: Diagnosis not present

## 2021-10-08 DIAGNOSIS — I10 Essential (primary) hypertension: Secondary | ICD-10-CM | POA: Diagnosis not present

## 2021-10-08 DIAGNOSIS — E1151 Type 2 diabetes mellitus with diabetic peripheral angiopathy without gangrene: Secondary | ICD-10-CM | POA: Diagnosis not present

## 2021-10-08 DIAGNOSIS — I739 Peripheral vascular disease, unspecified: Secondary | ICD-10-CM | POA: Diagnosis not present

## 2021-10-08 DIAGNOSIS — R252 Cramp and spasm: Secondary | ICD-10-CM | POA: Diagnosis not present

## 2021-10-19 DIAGNOSIS — G4733 Obstructive sleep apnea (adult) (pediatric): Secondary | ICD-10-CM | POA: Diagnosis not present

## 2021-11-10 ENCOUNTER — Encounter: Payer: Self-pay | Admitting: Emergency Medicine

## 2021-11-10 ENCOUNTER — Ambulatory Visit: Payer: Medicare HMO | Admitting: Emergency Medicine

## 2021-11-10 DIAGNOSIS — G4733 Obstructive sleep apnea (adult) (pediatric): Secondary | ICD-10-CM

## 2021-11-10 NOTE — Patient Instructions (Signed)
Please continue your CPAP every night as you have been doing. Call us if have any difficulty with your device or any new problems. Follow Dr. Lamonte Sakai in 1 year or as needed

## 2021-11-10 NOTE — Assessment & Plan Note (Signed)
Doing quite well.  Good compliance and great clinical benefit.  He has significantly improved energy, better sleep quality.  He does not go nights without it.  Plan continue same plan as he has good control of events on his current setting 9 cmH2O.  Please continue your CPAP every night as you have been doing. Call us if have any difficulty with your device or any new problems. Follow Dr. Lamonte Sakai in 1 year or as needed

## 2021-11-10 NOTE — Progress Notes (Addendum)
Subjective:    Patient ID: Ricardo [name], male    DOB: September 20, 1940, 81 y.o.   MRN: [id number]  Cough    ROV 11/06/20 --follow-up visit for 81 year old gentleman with a history of former tobacco use and obstructive sleep apnea.  He was formally on an oral appliance to control his OSA but subsequently changed to CPAP.  Currently on 9 cmH2O.  He reports today that he is doing well. His sleep quality is much better with the CPAP. Less daytime sleepiness - does not nap, no unintended sleep.  CPAP compliance report is available for 30 days which shows 100% usage for greater than 4 hours.  Minimal leak, AHI 1.3/h. He has his supplies, provided by [employer].   ROV 11/10/21 --Ricardo [name] follows up today for history of OSA.  He is 18, former smoker, uses CPAP 9 cmH2O.  Today he reports that he has been doing well. New device about 3 yrs ago. Uses a nasal mask. Good clinical benefit - uninterrupted sleep, less daytime sleepiness, better energy.  Compliance data is available and shows that he uses his CPAP for greater than 4 hours 97% of the time.  Set on 9 cmH2O.  Minimal leak and good control of his events, AHI 1.7/h  Review of Systems  Respiratory:  Negative for cough.     Past Medical History:  Diagnosis Date   Allergic rhinitis 05/24/2007   Qualifier: Diagnosis of  By: [name] RN, [name]     Chest pain    Cough 07/13/2016   Depression    Diabetes mellitus without complication ([place])    Dyspnea    Dysrhythmia    Falls frequently    Hemorrhoids    History of pneumonia    Hx of colonic polyps 10/31/2002   Hyperplastic    Hypertension    Leg cramps    Numbness and tingling in both hands    Obstructive sleep apnea    CPAP   Peptic ulcer    Syncope    Tachycardia      Family History  Problem Relation Age of Onset   Stroke Father    Hypertension Mother    Pancreatic cancer Mother    Anuerysm Brother        AAA   Anuerysm Father        AAA     Social History   Socioeconomic History    Marital status: Married    Spouse name: Not on file   Number of children: 3   Years of education: Not on file   Highest education level: Not on file  Occupational History   Occupation: Lobbyist: [employer]  Tobacco Use   Smoking status: Former    Types: Pipe    Quit date: 04/05/1988    Years since quitting: 33.6   Smokeless tobacco: Never  Substance and Sexual Activity   Alcohol use: Yes    Comment: occasionally   Drug use: No   Sexual activity: Not on file  Other Topics Concern   Not on file  Social History Narrative   Not on file   Social Determinants of Health   Financial Resource Strain: Not on file  Food Insecurity: Not on file  Transportation Needs: Not on file  Physical Activity: Not on file  Stress: Not on file  Social Connections: Not on file  Intimate Partner Violence: Not on file     No Known Allergies   Outpatient Medications Prior to Visit  Medication Sig Dispense Refill   acetaminophen (TYLENOL) 650 MG CR tablet Take 650 mg by mouth every 8 (eight) hours.     amLODipine (NORVASC) 5 MG tablet Take 5 mg by mouth daily.     Ascorbic Acid (VITAMIN C) 500 MG CAPS Take 500 mg by mouth daily.     aspirin 81 MG tablet Take 81 mg by mouth daily. Reported on 07/07/2015     cholecalciferol (VITAMIN D3) 25 MCG (1000 UNIT) tablet Take 2,000 Units by mouth daily.     escitalopram (LEXAPRO) 10 MG tablet Take 10 mg by mouth daily.     irbesartan-hydrochlorothiazide (AVALIDE) 300-12.5 MG tablet Take 1 tablet by mouth daily. Taking a Half tablet twice daily     metFORMIN (GLUCOPHAGE-XR) 500 MG 24 hr tablet Take 500 mg by mouth daily.     methocarbamol (ROBAXIN) 500 MG tablet Take 1 tablet (500 mg total) by mouth every 6 (six) hours as needed for muscle spasms. 40 tablet 3   Multiple Vitamin (MULTIVITAMIN) tablet Take 1 tablet by mouth daily.     Naproxen Sodium 220 MG CAPS Take 220 mg by mouth in the morning and at bedtime.     oxyCODONE-acetaminophen  (PERCOCET/ROXICET) 5-325 MG tablet Take 1-2 tablets by mouth every 4 (four) hours as needed for moderate pain or severe pain. 50 tablet 0   No facility-administered medications prior to visit.         Objective:   Physical Exam Vitals:   11/10/21 1544  BP: 132/74  Pulse: 64  Temp: 99 F (37.2 C)  TempSrc: Oral  SpO2: 96%  Weight: 215 lb 3.2 oz (97.6 kg)  Height: '5\' 11"'$  (1.803 m)   Gen: Pleasant, well-nourished, in no distress,  normal affect  ENT: No lesions,  mouth clear,  oropharynx clear, no postnasal drip  Neck: No JVD, no stridor  Lungs: No use of accessory muscles,  clear without rales or rhonchi  Cardiovascular: RRR, heart sounds normal, no murmur or gallops, no peripheral edema  Musculoskeletal: No deformities, no cyanosis or clubbing  Neuro: alert, non focal  Skin: Warm, no lesions or rashes       Assessment & Plan:  Obstructive sleep apnea Doing quite well.  Good compliance and great clinical benefit.  He has significantly improved energy, better sleep quality.  He does not go nights without it.  Plan continue same plan as he has good control of events on his current setting 9 cmH2O.  Please continue your CPAP every night as you have been doing. Call us if have any difficulty with your device or any new problems. Follow Dr. Lamonte Sakai in 1 year or as needed  Baltazar Apo, MD, PhD 11/10/2021, 5:34 PM [facility] [phone] or if no answer [phone]   Addendum: Patient is well controlled with CPAP.  He is low risk for general anesthesia or shoulder surgery.  No contraindications.  There may be some benefit to having CPAP available postoperatively as he wakes up from anesthesia.  Baltazar Apo, MD, PhD 12/24/2021, 5:15 PM [facility] [phone] or if no answer before 7:00PM call [phone] For any issues after 7:00PM please call eLink [phone]

## 2021-11-20 DIAGNOSIS — M25512 Pain in left shoulder: Secondary | ICD-10-CM | POA: Diagnosis not present

## 2021-11-27 DIAGNOSIS — M25512 Pain in left shoulder: Secondary | ICD-10-CM | POA: Diagnosis not present

## 2021-11-30 IMAGING — CR DG LUMBAR SPINE 2-3V
2 series · 2 of 2 positions shown · non-contrast
Comparison: Lumbar spine MRI 02/01/2020

CLINICAL DATA: Lumbar surgery.

EXAM:
LUMBAR SPINE - 2-3 VIEW

[lateral (1 of 2)]
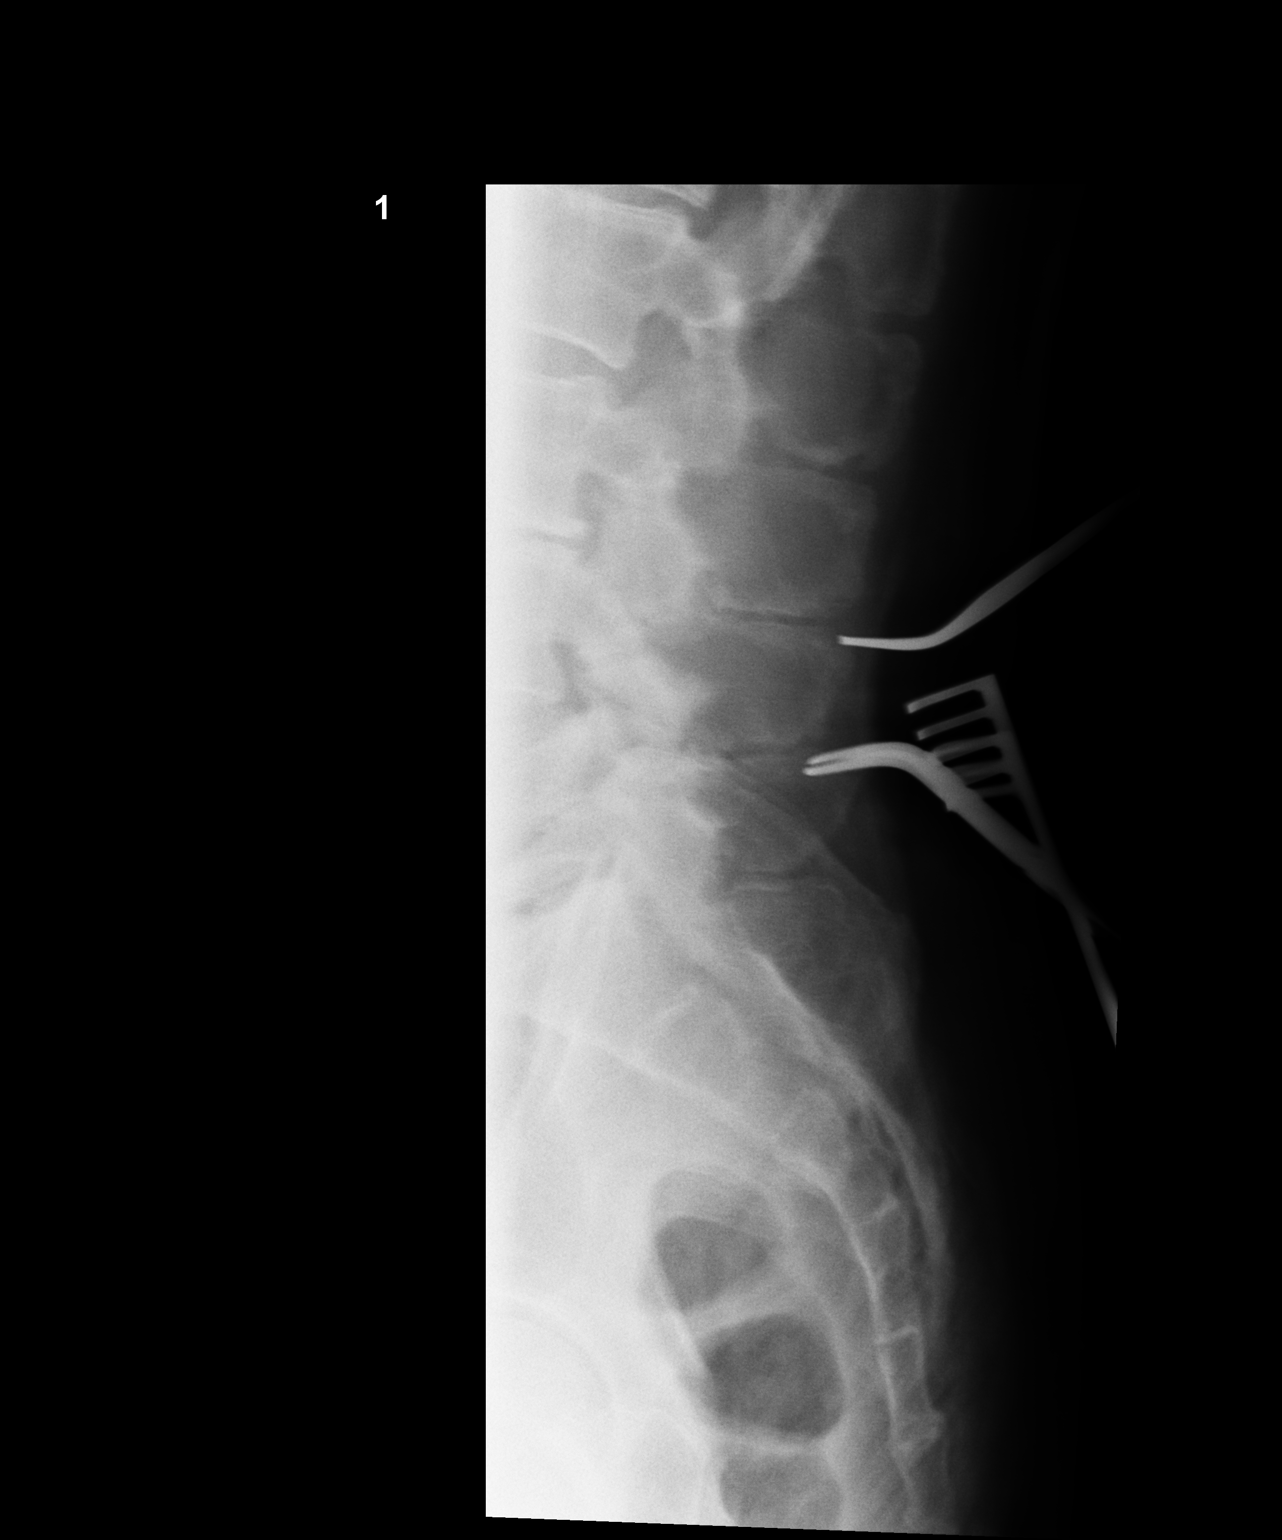

[lateral (2 of 2)]
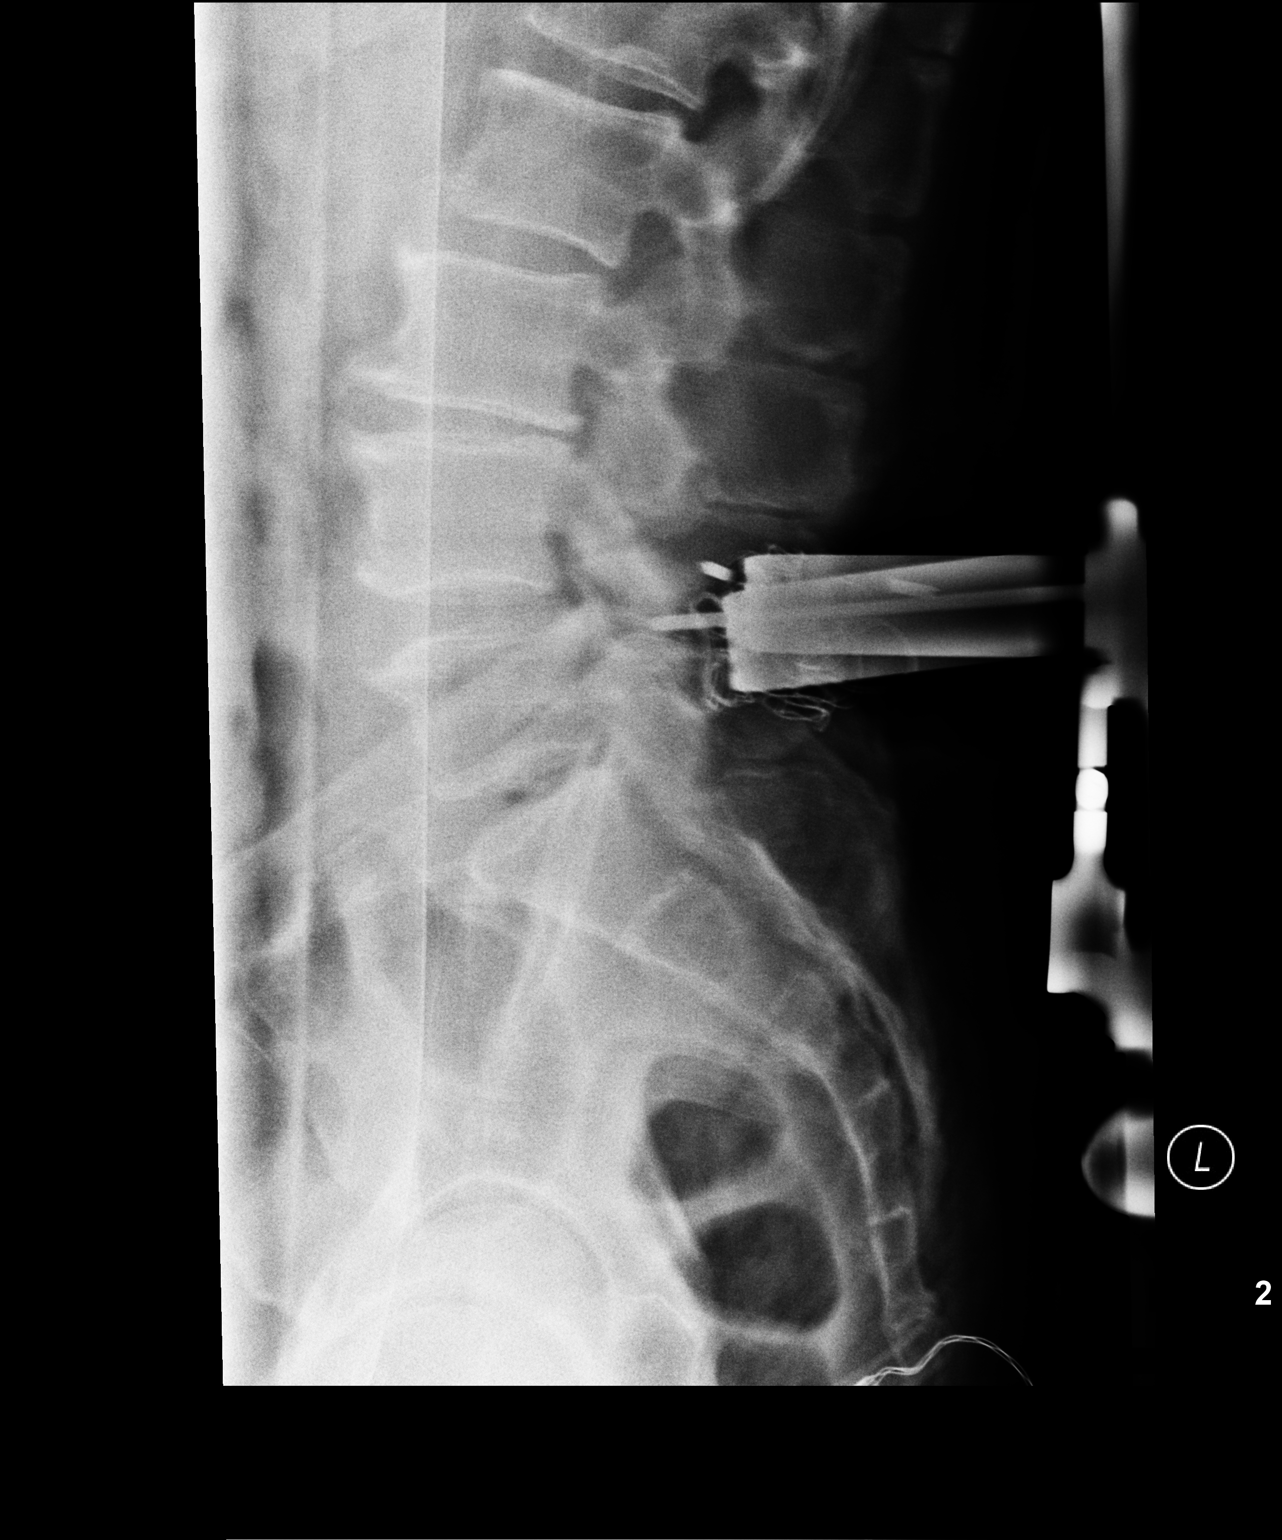

[2 of 2 positions shown; findings below may reference images not displayed]

FINDINGS: 2 intraoperative cross-table lateral radiographs of the lumbar spine
are provided. The first image demonstrates clamps projecting over
the posterosuperior aspects of the L4 and L5 spinous processes. On
the second image, the tip of a surgical instrument projects over the
posterior elements directed towards the L4-5 disc space. Tissue
retractors are in place.
IMPRESSION: Intraoperative localization as above.

## 2021-11-30 IMAGING — RF DG LUMBAR SPINE 2-3V
1 series · 2 of 2 positions shown · non-contrast
Comparison: Lumbar spine MRI 02/01/2020.

CLINICAL DATA: Surgery, elective. Additional history provided: PLIF
L4-L5. Provided fluoroscopy time 26 seconds (20.73 mGy).

EXAM:
LUMBAR SPINE - 2-3 VIEW; DG C-ARM 1-60 MIN

[Series 1: run · 2 of 2 slices shown]
[im 1/2]
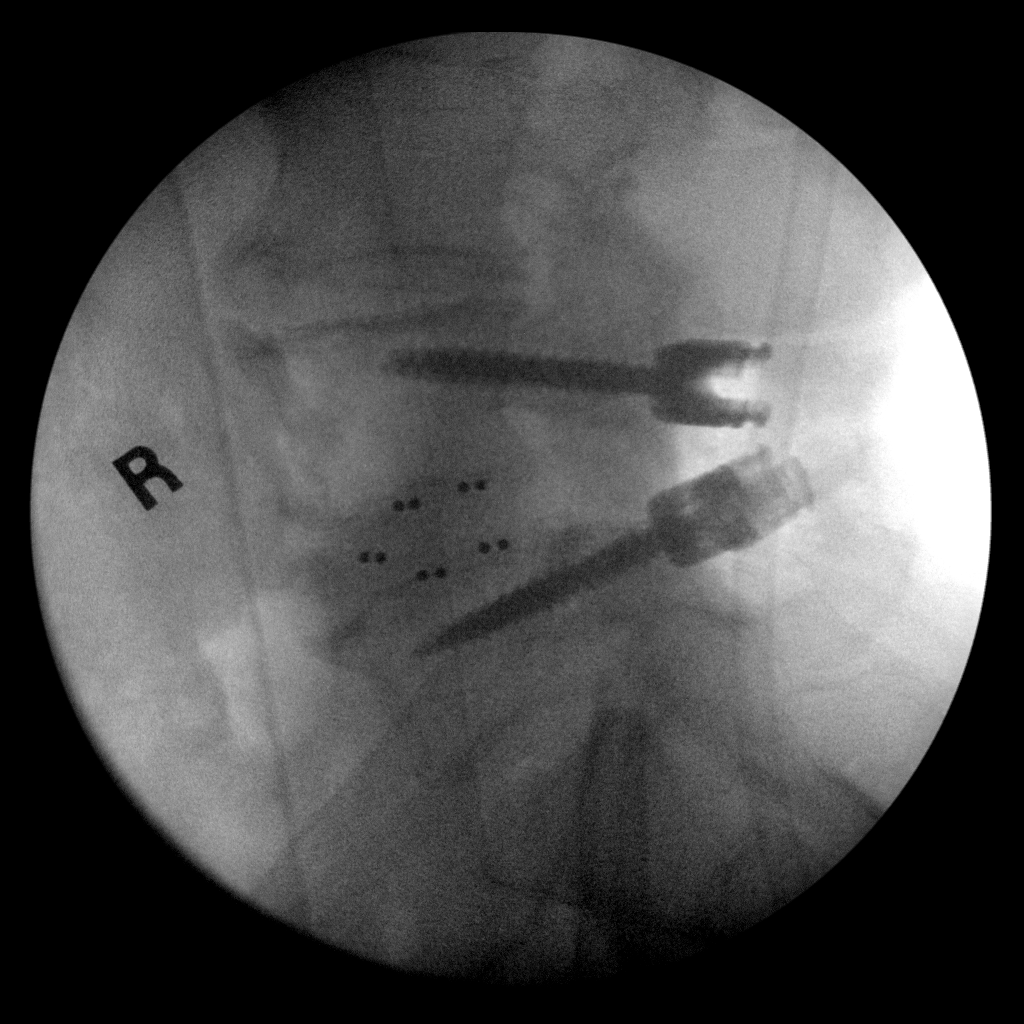
[im 2/2]
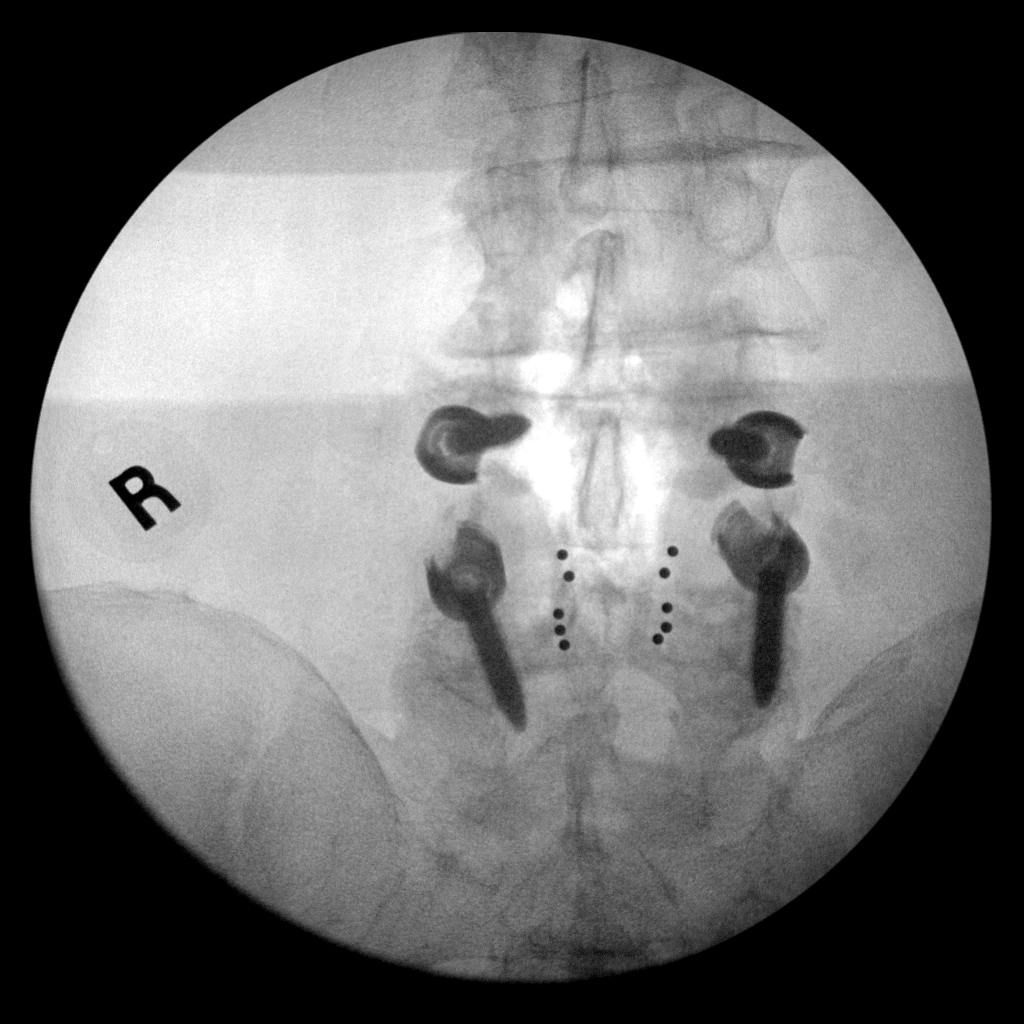

[2 of 2 positions shown; findings below may reference images not displayed]

FINDINGS: PA and lateral view intraoperative fluoroscopic images of the lumbar
spine are submitted, 2 images total. The images demonstrate
bilateral pedicle screws, and an interbody device, at L4-L5.
Vertical interconnecting rods were not present at the time the
images were taken.
IMPRESSION: Two intraoperative fluoroscopic images of the lumbar spine from
L4-L5 posterior fusion, as described.

## 2021-12-03 DIAGNOSIS — M75102 Unspecified rotator cuff tear or rupture of left shoulder, not specified as traumatic: Secondary | ICD-10-CM | POA: Diagnosis not present

## 2021-12-14 DIAGNOSIS — M19011 Primary osteoarthritis, right shoulder: Secondary | ICD-10-CM | POA: Insufficient documentation

## 2021-12-15 DIAGNOSIS — M75120 Complete rotator cuff tear or rupture of unspecified shoulder, not specified as traumatic: Secondary | ICD-10-CM | POA: Insufficient documentation

## 2021-12-15 DIAGNOSIS — M75122 Complete rotator cuff tear or rupture of left shoulder, not specified as traumatic: Secondary | ICD-10-CM | POA: Diagnosis not present

## 2021-12-15 DIAGNOSIS — M19012 Primary osteoarthritis, left shoulder: Secondary | ICD-10-CM | POA: Diagnosis not present

## 2021-12-17 ENCOUNTER — Telehealth: Payer: Self-pay

## 2021-12-17 NOTE — Telephone Encounter (Signed)
   Pre-operative Risk Assessment    Patient Name: Ricardo [name]  DOB: [birth year] MRN: [id number]     Request for Surgical Clearance    Procedure:   Left shoulder scope SAD/DCR/RCR/possible biceps tenotomy  Date of Surgery:  Clearance TBD                                 Surgeon:  Dr Jonn Shingles Surgeon's Group or Practice Name:  [facility]   Phone number:  [phone] Fax number:  [phone] Marianna Fuss [place])   Type of Clearance Requested:   - Medical  - Pharmacy:  Hold Aspirin     Type of Anesthesia:  General with interscalene block   Additional requests/questions:   n/a  Grace Isaac, Rebekka Lobello T   12/17/2021, 10:59 AM

## 2021-12-18 NOTE — Telephone Encounter (Signed)
Primary Cardiologist:Philip Nahser, MD   Preoperative team, please contact this patient and set up a phone call appointment for further preoperative risk assessment. Please obtain consent and complete medication review. Thank you for your help.   Request to hold aspirin, however there is no prior cardiac stenting, therefore reason for taking aspirin should be clarified during the virtual visit.    Emmaline Life, NP-C     12/18/2021, 12:15 PM [address], [address] Office [phone] Fax [phone]

## 2021-12-22 ENCOUNTER — Telehealth: Payer: Self-pay | Admitting: *Deleted

## 2021-12-22 NOTE — Telephone Encounter (Signed)
Pt agreeable to plan of care for tele pre op appt 12/29/21 @ 3 pm. Med rec and consent are done.

## 2021-12-22 NOTE — Telephone Encounter (Signed)
Pt agreeable to plan of care for tele pre op appt 12/29/21 @ 3 pm. Med rec and consent are done.    Patient Consent for Virtual Visit        Ricardo [name] has provided verbal consent on 12/22/2021 for a virtual visit (video or telephone).   CONSENT FOR VIRTUAL VISIT FOR:  Ricardo [name]  By participating in this virtual visit I agree to the following:  I hereby voluntarily request, consent and authorize [facility] and its employed or contracted physicians, physician assistants, nurse practitioners or other licensed health care professionals (the Practitioner), to provide me with telemedicine health care services (the "Services") as deemed necessary by the treating Practitioner. I acknowledge and consent to receive the Services by the Practitioner via telemedicine. I understand that the telemedicine visit will involve communicating with the Practitioner through live audiovisual communication technology and the disclosure of certain medical information by electronic transmission. I acknowledge that I have been given the opportunity to request an in-person assessment or other available alternative prior to the telemedicine visit and am voluntarily participating in the telemedicine visit.  I understand that I have the right to withhold or withdraw my consent to the use of telemedicine in the course of my care at any time, without affecting my right to future care or treatment, and that the Practitioner or I may terminate the telemedicine visit at any time. I understand that I have the right to inspect all information obtained and/or recorded in the course of the telemedicine visit and may receive copies of available information for a reasonable fee.  I understand that some of the potential risks of receiving the Services via telemedicine include:  Delay or interruption in medical evaluation due to technological equipment failure or disruption; Information transmitted may not be sufficient  (e.g. poor resolution of images) to allow for appropriate medical decision making by the Practitioner; and/or  In rare instances, security protocols could fail, causing a breach of personal health information.  Furthermore, I acknowledge that it is my responsibility to provide information about my medical history, conditions and care that is complete and accurate to the best of my ability. I acknowledge that Practitioner's advice, recommendations, and/or decision may be based on factors not within their control, such as incomplete or inaccurate data provided by me or distortions of diagnostic images or specimens that may result from electronic transmissions. I understand that the practice of medicine is not an exact science and that Practitioner makes no warranties or guarantees regarding treatment outcomes. I acknowledge that a copy of this consent can be made available to me via my patient portal ([facility]), or I can request a printed copy by calling the office of [place].    I understand that my insurance will be billed for this visit.   I have read or had this consent read to me. I understand the contents of this consent, which adequately explains the benefits and risks of the Services being provided via telemedicine.  I have been provided ample opportunity to ask questions regarding this consent and the Services and have had my questions answered to my satisfaction. I give my informed consent for the services to be provided through the use of telemedicine in my medical care

## 2021-12-24 ENCOUNTER — Telehealth: Payer: Self-pay | Admitting: Emergency Medicine

## 2021-12-24 NOTE — Telephone Encounter (Signed)
OV notes and clearance form have been faxed back to [facility]. Nothing further needed at this time. ?

## 2021-12-24 NOTE — Telephone Encounter (Signed)
Fax received from Dr. Hart Robinsons with [facility] to perform a Left Shoulder Scope on patient.  Patient needs surgery clearance. Surgery is PENDING. Patient was seen on 11/10/2021. Office protocol is a risk assessment can be sent to surgeon if patient has been seen in 60 days or less.   Sending to Dr Lamonte Sakai for risk assessment or recommendations if patient needs to be seen in office prior to surgical procedure.

## 2021-12-24 NOTE — Telephone Encounter (Signed)
I made an addendum to his note from 11/10/2021 to indicate that he is low risk for surgery.

## 2021-12-29 ENCOUNTER — Encounter (HOSPITAL_BASED_OUTPATIENT_CLINIC_OR_DEPARTMENT_OTHER): Payer: Self-pay | Admitting: Family

## 2021-12-29 ENCOUNTER — Ambulatory Visit (INDEPENDENT_AMBULATORY_CARE_PROVIDER_SITE_OTHER): Payer: Medicare HMO | Admitting: Family

## 2021-12-29 VITALS — Ht 71.0 in

## 2021-12-29 DIAGNOSIS — Z01818 Encounter for other preprocedural examination: Secondary | ICD-10-CM | POA: Diagnosis not present

## 2021-12-29 NOTE — Progress Notes (Signed)
Virtual Visit via Telephone Note   Because of Ricardo [name]'s co-morbid illnesses, he is at least at moderate risk for complications without adequate follow up.  This format is felt to be most appropriate for this patient at this time.  The patient did not have access to video technology/had technical difficulties with video requiring transitioning to audio format only (telephone).  All issues noted in this document were discussed and addressed.  No physical exam could be performed with this format.  Please refer to the patient's chart for his consent to telehealth for [employer]    Date:  12/29/2021   ID:  Ricardo [name], DOB 1940-04-25, MRN [id number] The patient was identified using 2 identifiers.  Patient Location: Home Provider Location: Home Office   PCP:  Donnajean [name], [place] [facility] Cardiologist:  Ricardo Moores, MD     Evaluation Performed:  Follow-Up Visit  Chief Complaint:  Preop  History of Present Illness:    Ricardo [name] is a 81 y.o. male with hypertension, OSA, DM2, orthostatic hypotension. Echo 2008 and myoview 2009 unremarkable. Last seen 07/20/21 doing well from cardiac perspective and recommended to follow up in one year.   Presents today for preoperative clearance for left shoulder scope SAD/DCR/RCR with possible biceps tenotomy. Request to hold aspirin.   Tells me since last seen has been well. Reports no shortness of breath nor dyspnea on exertion. Reports no chest pain, pressure, or tightness. No edema, orthopnea, PND. Reports no palpitations.  Not checking blood pressure routinely at home. Continues be active working in his yard including push mowing.   Past Medical History:  Diagnosis Date   Allergic rhinitis 05/24/2007   Qualifier: Diagnosis of  By: [name] RN, [name]     Chest pain    Cough 07/13/2016   Depression    Diabetes mellitus without complication ([place])    Dyspnea    Dysrhythmia    Falls frequently     Hemorrhoids    History of pneumonia    Hx of colonic polyps 10/31/2002   Hyperplastic    Hypertension    Leg cramps    Numbness and tingling in both hands    Obstructive sleep apnea    CPAP   Peptic ulcer    Syncope    Tachycardia    Past Surgical History:  Procedure Laterality Date   APPENDECTOMY  1957     Current Meds  Medication Sig   acetaminophen (TYLENOL) 650 MG CR tablet Take 650 mg by mouth every 8 (eight) hours.   amLODipine (NORVASC) 5 MG tablet Take 5 mg by mouth daily.   Ascorbic Acid (VITAMIN C) 500 MG CAPS Take 500 mg by mouth daily.   aspirin 81 MG tablet Take 81 mg by mouth daily. Reported on 07/07/2015   cholecalciferol (VITAMIN D3) 25 MCG (1000 UNIT) tablet Take 2,000 Units by mouth daily.   escitalopram (LEXAPRO) 10 MG tablet Take 10 mg by mouth daily.   irbesartan-hydrochlorothiazide (AVALIDE) 300-12.5 MG tablet Take 1 tablet by mouth daily. Taking a Half tablet twice daily   metFORMIN (GLUCOPHAGE-XR) 500 MG 24 hr tablet Take 500 mg by mouth daily.   methocarbamol (ROBAXIN) 500 MG tablet Take 1 tablet (500 mg total) by mouth every 6 (six) hours as needed for muscle spasms.   Multiple Vitamin (MULTIVITAMIN) tablet Take 1 tablet by mouth daily.   Naproxen Sodium 220 MG CAPS Take 220 mg by mouth in the morning and  at bedtime.   oxyCODONE-acetaminophen (PERCOCET/ROXICET) 5-325 MG tablet Take 1-2 tablets by mouth every 4 (four) hours as needed for moderate pain or severe pain.     Allergies:   Patient has no known allergies.   Social History   Tobacco Use   Smoking status: Former    Types: Pipe    Quit date: 04/05/1988    Years since quitting: 33.7   Smokeless tobacco: Never  Substance Use Topics   Alcohol use: Yes    Comment: occasionally   Drug use: No     Family Hx: The patient's family history includes Anuerysm in his brother and father; Hypertension in his mother; Pancreatic cancer in his mother; Stroke in his father.  ROS:   Please see the history  of present illness.     All other systems reviewed and are negative.   Prior CV studies:   The following studies were reviewed today:  None  Labs/Other Tests and Data Reviewed:    EKG:  No ECG reviewed.  Recent Labs: No results found for requested labs within last 365 days.   Recent Lipid Panel No results found for: "CHOL", "TRIG", "HDL", "CHOLHDL", "LDLCALC", "LDLDIRECT"  Wt Readings from Last 3 Encounters:  11/10/21 215 lb 3.2 oz (97.6 kg)  07/20/21 214 lb 6.4 oz (97.3 kg)  11/06/20 217 lb 12.8 oz (98.8 kg)         Objective:    Vital Signs:  Ht '5\' 11"'$  (1.803 m)   BMI 30.01 kg/m    VITAL SIGNS:  reviewed  Given telephonic nature of communication, physical exam is limited: Alert and oriented x3. No acute distress. Normal affect. Speech and respirations are unlabored.   ASSESSMENT & PLAN:    Preop - According to the Revised Cardiac Risk Index (RCRI), his Perioperative Risk of Major Cardiac Event is (%): 0.4. His  Functional Capacity in METs is: 5.29 according to the [place] Activity Status Index (DASI). He is deemed acceptable risk for the planned procedure without additional cardiovascular testing. May hold Aspirin 7 days prior to planned procedure per office protocols. Will route to surgical team so they are aware.     Time:   Today, I have spent 6 minutes with the patient with telehealth technology discussing the above problems.     Medication Adjustments/Labs and Tests Ordered: Current medicines are reviewed at length with the patient today.  Concerns regarding medicines are outlined above.   Tests Ordered: No orders of the defined types were placed in this encounter.   Medication Changes: No orders of the defined types were placed in this encounter.   Follow Up:  In Person  07/2022 with Dr. Acie Fredrickson  Signed, Loel Dubonnet, NP  12/29/2021 3:09 PM    Ricardo [name]

## 2022-01-21 DIAGNOSIS — M7522 Bicipital tendinitis, left shoulder: Secondary | ICD-10-CM | POA: Diagnosis not present

## 2022-01-21 DIAGNOSIS — M19012 Primary osteoarthritis, left shoulder: Secondary | ICD-10-CM | POA: Diagnosis not present

## 2022-01-21 DIAGNOSIS — Y999 Unspecified external cause status: Secondary | ICD-10-CM | POA: Diagnosis not present

## 2022-01-21 DIAGNOSIS — X58XXXA Exposure to other specified factors, initial encounter: Secondary | ICD-10-CM | POA: Diagnosis not present

## 2022-01-21 DIAGNOSIS — G8918 Other acute postprocedural pain: Secondary | ICD-10-CM | POA: Diagnosis not present

## 2022-01-21 DIAGNOSIS — S46012A Strain of muscle(s) and tendon(s) of the rotator cuff of left shoulder, initial encounter: Secondary | ICD-10-CM | POA: Diagnosis not present

## 2022-01-21 DIAGNOSIS — M7542 Impingement syndrome of left shoulder: Secondary | ICD-10-CM | POA: Diagnosis not present

## 2022-01-21 DIAGNOSIS — M75122 Complete rotator cuff tear or rupture of left shoulder, not specified as traumatic: Secondary | ICD-10-CM | POA: Diagnosis not present

## 2022-01-21 DIAGNOSIS — S46112A Strain of muscle, fascia and tendon of long head of biceps, left arm, initial encounter: Secondary | ICD-10-CM | POA: Diagnosis not present

## 2022-01-29 DIAGNOSIS — Z936 Other artificial openings of urinary tract status: Secondary | ICD-10-CM | POA: Diagnosis not present

## 2022-02-03 DIAGNOSIS — Z4889 Encounter for other specified surgical aftercare: Secondary | ICD-10-CM | POA: Diagnosis not present

## 2022-02-11 DIAGNOSIS — L821 Other seborrheic keratosis: Secondary | ICD-10-CM | POA: Diagnosis not present

## 2022-02-11 DIAGNOSIS — D2262 Melanocytic nevi of left upper limb, including shoulder: Secondary | ICD-10-CM | POA: Diagnosis not present

## 2022-02-11 DIAGNOSIS — D1801 Hemangioma of skin and subcutaneous tissue: Secondary | ICD-10-CM | POA: Diagnosis not present

## 2022-02-11 DIAGNOSIS — Z85828 Personal history of other malignant neoplasm of skin: Secondary | ICD-10-CM | POA: Diagnosis not present

## 2022-02-11 DIAGNOSIS — D225 Melanocytic nevi of trunk: Secondary | ICD-10-CM | POA: Diagnosis not present

## 2022-02-11 DIAGNOSIS — D485 Neoplasm of uncertain behavior of skin: Secondary | ICD-10-CM | POA: Diagnosis not present

## 2022-02-11 DIAGNOSIS — D2261 Melanocytic nevi of right upper limb, including shoulder: Secondary | ICD-10-CM | POA: Diagnosis not present

## 2022-02-11 DIAGNOSIS — D0439 Carcinoma in situ of skin of other parts of face: Secondary | ICD-10-CM | POA: Diagnosis not present

## 2022-02-11 DIAGNOSIS — L570 Actinic keratosis: Secondary | ICD-10-CM | POA: Diagnosis not present

## 2022-02-11 DIAGNOSIS — L814 Other melanin hyperpigmentation: Secondary | ICD-10-CM | POA: Diagnosis not present

## 2022-02-11 DIAGNOSIS — Z85820 Personal history of malignant melanoma of skin: Secondary | ICD-10-CM | POA: Diagnosis not present

## 2022-02-15 DIAGNOSIS — G4733 Obstructive sleep apnea (adult) (pediatric): Secondary | ICD-10-CM | POA: Diagnosis not present

## 2022-03-08 DIAGNOSIS — R739 Hyperglycemia, unspecified: Secondary | ICD-10-CM | POA: Diagnosis not present

## 2022-03-08 DIAGNOSIS — I10 Essential (primary) hypertension: Secondary | ICD-10-CM | POA: Diagnosis not present

## 2022-03-08 DIAGNOSIS — E785 Hyperlipidemia, unspecified: Secondary | ICD-10-CM | POA: Diagnosis not present

## 2022-03-08 DIAGNOSIS — E1151 Type 2 diabetes mellitus with diabetic peripheral angiopathy without gangrene: Secondary | ICD-10-CM | POA: Diagnosis not present

## 2022-03-08 DIAGNOSIS — R7989 Other specified abnormal findings of blood chemistry: Secondary | ICD-10-CM | POA: Diagnosis not present

## 2022-03-08 DIAGNOSIS — Z125 Encounter for screening for malignant neoplasm of prostate: Secondary | ICD-10-CM | POA: Diagnosis not present

## 2022-03-15 DIAGNOSIS — E1151 Type 2 diabetes mellitus with diabetic peripheral angiopathy without gangrene: Secondary | ICD-10-CM | POA: Diagnosis not present

## 2022-03-15 DIAGNOSIS — Z1339 Encounter for screening examination for other mental health and behavioral disorders: Secondary | ICD-10-CM | POA: Diagnosis not present

## 2022-03-15 DIAGNOSIS — E785 Hyperlipidemia, unspecified: Secondary | ICD-10-CM | POA: Diagnosis not present

## 2022-03-15 DIAGNOSIS — I872 Venous insufficiency (chronic) (peripheral): Secondary | ICD-10-CM | POA: Diagnosis not present

## 2022-03-15 DIAGNOSIS — Z1212 Encounter for screening for malignant neoplasm of rectum: Secondary | ICD-10-CM | POA: Diagnosis not present

## 2022-03-15 DIAGNOSIS — I10 Essential (primary) hypertension: Secondary | ICD-10-CM | POA: Diagnosis not present

## 2022-03-15 DIAGNOSIS — Z1331 Encounter for screening for depression: Secondary | ICD-10-CM | POA: Diagnosis not present

## 2022-03-15 DIAGNOSIS — I350 Nonrheumatic aortic (valve) stenosis: Secondary | ICD-10-CM | POA: Diagnosis not present

## 2022-03-15 DIAGNOSIS — I739 Peripheral vascular disease, unspecified: Secondary | ICD-10-CM | POA: Diagnosis not present

## 2022-03-15 DIAGNOSIS — R82998 Other abnormal findings in urine: Secondary | ICD-10-CM | POA: Diagnosis not present

## 2022-03-15 DIAGNOSIS — Z0000 Encounter for general adult medical examination without abnormal findings: Secondary | ICD-10-CM | POA: Diagnosis not present

## 2022-03-15 DIAGNOSIS — G4733 Obstructive sleep apnea (adult) (pediatric): Secondary | ICD-10-CM | POA: Diagnosis not present

## 2022-03-15 DIAGNOSIS — R200 Anesthesia of skin: Secondary | ICD-10-CM | POA: Diagnosis not present

## 2022-03-15 DIAGNOSIS — R5383 Other fatigue: Secondary | ICD-10-CM | POA: Diagnosis not present

## 2022-03-15 DIAGNOSIS — Z23 Encounter for immunization: Secondary | ICD-10-CM | POA: Diagnosis not present

## 2022-03-16 DIAGNOSIS — M25512 Pain in left shoulder: Secondary | ICD-10-CM | POA: Diagnosis not present

## 2022-03-23 DIAGNOSIS — M25512 Pain in left shoulder: Secondary | ICD-10-CM | POA: Diagnosis not present

## 2022-03-25 DIAGNOSIS — M25512 Pain in left shoulder: Secondary | ICD-10-CM | POA: Diagnosis not present

## 2022-04-14 DIAGNOSIS — M25512 Pain in left shoulder: Secondary | ICD-10-CM | POA: Diagnosis not present

## 2022-04-16 DIAGNOSIS — M25512 Pain in left shoulder: Secondary | ICD-10-CM | POA: Diagnosis not present

## 2022-04-19 ENCOUNTER — Encounter: Payer: Self-pay | Admitting: Gastroenterology

## 2022-04-20 DIAGNOSIS — M25512 Pain in left shoulder: Secondary | ICD-10-CM | POA: Diagnosis not present

## 2022-04-23 DIAGNOSIS — M25512 Pain in left shoulder: Secondary | ICD-10-CM | POA: Diagnosis not present

## 2022-04-28 DIAGNOSIS — M25512 Pain in left shoulder: Secondary | ICD-10-CM | POA: Diagnosis not present

## 2022-04-30 DIAGNOSIS — M25512 Pain in left shoulder: Secondary | ICD-10-CM | POA: Diagnosis not present

## 2022-05-03 DIAGNOSIS — M25512 Pain in left shoulder: Secondary | ICD-10-CM | POA: Diagnosis not present

## 2022-05-05 DIAGNOSIS — M25512 Pain in left shoulder: Secondary | ICD-10-CM | POA: Diagnosis not present

## 2022-05-10 ENCOUNTER — Telehealth: Payer: Self-pay | Admitting: Neurology

## 2022-05-10 ENCOUNTER — Encounter: Payer: Self-pay | Admitting: Neurology

## 2022-05-10 ENCOUNTER — Ambulatory Visit: Payer: Medicare HMO | Admitting: Neurology

## 2022-05-10 VITALS — BP 146/82 | HR 64 | Ht 70.0 in | Wt 211.0 lb

## 2022-05-10 DIAGNOSIS — M542 Cervicalgia: Secondary | ICD-10-CM

## 2022-05-10 DIAGNOSIS — M5136 Other intervertebral disc degeneration, lumbar region: Secondary | ICD-10-CM | POA: Diagnosis not present

## 2022-05-10 DIAGNOSIS — R269 Unspecified abnormalities of gait and mobility: Secondary | ICD-10-CM | POA: Diagnosis not present

## 2022-05-10 MED ORDER — GABAPENTIN 300 MG PO CAPS: 300.0000 mg | ORAL_CAPSULE | Freq: Three times a day (TID) | ORAL | 11 refills | Status: DC

## 2022-05-10 NOTE — Telephone Encounter (Signed)
[employer] medicare sent to [facility] they obtain auth [phone] 

## 2022-05-10 NOTE — Progress Notes (Addendum)
Chief Complaint  Patient presents with   New Patient (Initial Visit)    RM 14 alone. Here for consult on worsening balance. Reports symptoms on going for sometime now. Reports numbness/cramping in bilateral legs and feels like this is the cause of his unsteady gait.        ASSESSMENT AND PLAN  Ricardo [name] is a 82 y.o. male   Gait abnormality Frequent muscle cramping  Frequent bilateral lower extremity muscle cramping, brisk bilateral upper extremity and patellar reflex,  History of L4-5 decompression fusion by Dr. Ellene Route in February 2022, postsurgical MRI of lumbar in October 2022 showed L4-5 disc protrusion with mass effect on the left S1 nerve roots,  History of diabetes,  MRI of cervical spine to rule out cervical spondylitic myelopathy, may have superimposed peripheral neuropathy  MRI of lumbar spine for possible lumbar radiculopathy  EMG nerve conduction study  Gabapentin up to 300 mg 3 times a day for bilateral lower extremity muscle cramping   DIAGNOSTIC DATA (LABS, IMAGING, TESTING) - I reviewed patient records, labs, notes, testing and imaging myself where available.   MEDICAL HISTORY:  Ricardo [name] is a 82 year old male, seen in request by his primary care physician Dr. Leanna Battles for evaluation of gait abnormality, initial evaluation was on May 10, 2022  I reviewed and summarized the referring note. PMHX HTN Depression,  DM OSA-compliance with his CPAP  Lumbar decompression surgery in Feb 2022.  He presented with his right lumbar radicular pain, was seen by Dr. Ellene Route, spondylolisthesis L4-L5 with chronic radiculopathy, neurogenic claudication.eventually underwent decompressive fusion of L4-5 in February 2022, which has helped his right low back pain radiating pain to right lower extremity  However shortly after surgery, he began to noticed left leg discomfort, stretching numbing sensation at the posterior left leg, calf, to his knee,  I was able  to review postsurgical MRI lumbar in October 2022: Postsurgical changes at L4-5 with interval resolution of spinal canal stenosis at this level.Mild progression of spinal canal stenosis at L3-4, now mild to moderate with narrowing of the bilateral subarticular zones.Resolution of the superiorly migrating left subarticular disc extrusion at L5-S1 with persistent protruded disc causing mass effect on the traversing left S1 nerve root.  Per patient, he was re-evaluated by Dr. Sheppard Coil, decided conservative treatment,  Over the past couple years, he complains gradual worsening balance issues, not so confident while walking, especially at nighttime, he noticed frequent muscle cramping involving both legs, wake him up almost every night, he has to do forceful leg stretching to relieve the discomfort, occasionally have to get up in the middle of the night, tried muscle relaxant without helping his symptoms  He denies bowel or bladder incontinence, no hands paresthesia, does have neck pain at posterior cervical region    PHYSICAL EXAM:   Vitals:   05/10/22 1604  BP: (!) 146/82  Pulse: 64  Weight: 211 lb (95.7 kg)  Height: '5\' 10"'$  (1.778 m)      Body mass index is 30.28 kg/m.  PHYSICAL EXAMNIATION:  Gen: NAD, conversant, well nourised, well groomed                     Cardiovascular: Regular rate rhythm, no peripheral edema, warm, nontender. Eyes: Conjunctivae clear without exudates or hemorrhage Neck: Supple, no carotid bruits. Pulmonary: Clear to auscultation bilaterally   NEUROLOGICAL EXAM:  MENTAL STATUS: Speech/cognition: Awake, alert, oriented to history taking and casual conversation CRANIAL NERVES: CN II: Visual fields  are full to confrontation. Pupils are round equal and briskly reactive to light. CN III, IV, VI: extraocular movement are normal. No ptosis. CN V: Facial sensation is intact to light touch CN VII: Face is symmetric with normal eye closure  CN VIII: Hearing is  normal to causal conversation. CN IX, X: Phonation is normal. CN XI: Head turning and shoulder shrug are intact  MOTOR: Limited left upper extremity distress due to left shoulder pain,  REFLEXES: Reflexes are 2+ and symmetric at the biceps, triceps, 3/3 at knees, and ankles. Plantar responses are flexor.  SENSORY: Intact to light touch, pinprick and vibratory sensation are intact in fingers and toes.  COORDINATION: There is no trunk or limb dysmetria noted.  GAIT/STANCE: He can get up from seated position arm crossed, wide-based, cautious, mildly positive Romberg signs, able to stand up on tiptoes and heels  REVIEW OF SYSTEMS:  Full 14 system review of systems performed and notable only for as above All other review of systems were negative.   ALLERGIES: No Known Allergies  HOME MEDICATIONS: Current Outpatient Medications  Medication Sig Dispense Refill   acetaminophen (TYLENOL) 650 MG CR tablet Take by mouth every 8 (eight) hours. 1 1/2 every 8 hours     amLODipine (NORVASC) 5 MG tablet Take 5 mg by mouth daily.     Ascorbic Acid (VITAMIN C) 500 MG CAPS Take 500 mg by mouth daily.     aspirin 81 MG tablet Take 81 mg by mouth daily. Reported on 07/07/2015     cholecalciferol (VITAMIN D3) 25 MCG (1000 UNIT) tablet Take 2,000 Units by mouth daily.     escitalopram (LEXAPRO) 10 MG tablet Take 10 mg by mouth daily.     irbesartan-hydrochlorothiazide (AVALIDE) 300-12.5 MG tablet Take by mouth daily. Taking 1 1/2 per day     metFORMIN (GLUCOPHAGE-XR) 500 MG 24 hr tablet Take 500 mg by mouth daily.     Multiple Vitamin (MULTIVITAMIN) tablet Take 1 tablet by mouth daily.     Naproxen Sodium 220 MG CAPS Take 220 mg by mouth in the morning and at bedtime.     No current facility-administered medications for this visit.    PAST MEDICAL HISTORY: Past Medical History:  Diagnosis Date   Allergic rhinitis 05/24/2007   Qualifier: Diagnosis of  By: [name] RN, [name]     Chest pain     Cough 07/13/2016   Depression    Diabetes mellitus without complication ([place])    Dyspnea    Dysrhythmia    Falls frequently    Hemorrhoids    History of pneumonia    Hx of colonic polyps 10/31/2002   Hyperplastic    Hypertension    Leg cramps    Numbness and tingling in both hands    Obstructive sleep apnea    CPAP   Peptic ulcer    Syncope    Tachycardia     PAST SURGICAL HISTORY: Past Surgical History:  Procedure Laterality Date   APPENDECTOMY  1957    FAMILY HISTORY: Family History  Problem Relation Age of Onset   Stroke Father    Hypertension Mother    Pancreatic cancer Mother    Anuerysm Brother        AAA   Anuerysm Father        AAA    SOCIAL HISTORY: Social History   Socioeconomic History   Marital status: Married    Spouse name: Not on file   Number of children: 3  Years of education: Not on file   Highest education level: Not on file  Occupational History   Occupation: Chief Financial Officer    Employer: [employer]  Tobacco Use   Smoking status: Former    Types: Pipe    Quit date: 04/05/1988    Years since quitting: 34.1   Smokeless tobacco: Never  Substance and Sexual Activity   Alcohol use: Yes    Comment: occasionally   Drug use: No   Sexual activity: Not on file  Other Topics Concern   Not on file  Social History Narrative   Not on file   Social Determinants of Health   Financial Resource Strain: Not on file  Food Insecurity: Not on file  Transportation Needs: Not on file  Physical Activity: Not on file  Stress: Not on file  Social Connections: Not on file  Intimate Partner Violence: Not on file      Marcial Pacas, M.D. Ph.D.  [facility] [address], [place], [place] [address] Ph: [phone] Fax: [phone]  CC:  Donnajean Lopes, MD [address] [place],  [place] [address]  Donnajean Lopes, MD

## 2022-05-11 ENCOUNTER — Telehealth: Payer: Self-pay | Admitting: Neurology

## 2022-05-11 DIAGNOSIS — R269 Unspecified abnormalities of gait and mobility: Secondary | ICD-10-CM

## 2022-05-11 DIAGNOSIS — M25512 Pain in left shoulder: Secondary | ICD-10-CM | POA: Diagnosis not present

## 2022-05-11 DIAGNOSIS — M5136 Other intervertebral disc degeneration, lumbar region: Secondary | ICD-10-CM

## 2022-05-11 NOTE — Telephone Encounter (Signed)
Orders Placed This Encounter  Procedures   MR LUMBAR SPINE WO CONTRAST    I orderred MRI of lumbar, try to make it happen the same day as MRI cervical

## 2022-05-11 NOTE — Telephone Encounter (Signed)
Pt is calling. Stated he talked with Dr. Krista Blue about having a MRI of the neck and the Lumbar area. Pt stated GI called him about scheduling the MRI for the neck but not the lumbar. Pt said he wants to make sure both are ordered.

## 2022-05-11 NOTE — Telephone Encounter (Signed)
MRI lumbar order sent to GI

## 2022-05-12 ENCOUNTER — Telehealth: Payer: Self-pay | Admitting: Neurology

## 2022-05-12 NOTE — Telephone Encounter (Signed)
I failed to reach patient left message.  Please call patient again, first to check if gabapentin is helping his symptoms  If gabapentin 300 mg 3 times a day is too much, may consider lower dose gabapentin such as 100 mg 1 to 2 tablets 3 times a day,

## 2022-05-12 NOTE — Telephone Encounter (Signed)
Pt said gabapentin (NEURONTIN) 300 MG capsule causing double vision. Would like a call from the nurse to discuss if Dr. Krista Blue could prescribe something else.

## 2022-05-13 NOTE — Telephone Encounter (Signed)
I called pt. No answer, left a message asking pt to call me back.   

## 2022-05-13 NOTE — Telephone Encounter (Signed)
Spoke to patient and he reports that he has not taken the gabapentin due to the fact that when it took it a couple of years ago, it gave him double vision. He states that he is not having a terrible time at night and when the leg pain arises, he is able to stretch and then go back to sleep without much pain. Patient is now wondering if any med is needed or if he can try something different than gabapentin because he does not want to take it.

## 2022-05-14 DIAGNOSIS — M25512 Pain in left shoulder: Secondary | ICD-10-CM | POA: Diagnosis not present

## 2022-05-17 DIAGNOSIS — Z4789 Encounter for other orthopedic aftercare: Secondary | ICD-10-CM | POA: Diagnosis not present

## 2022-05-17 NOTE — Telephone Encounter (Signed)
It is Ok not to take medications for his symptoms, further discussion at next visit in may

## 2022-05-18 DIAGNOSIS — M25512 Pain in left shoulder: Secondary | ICD-10-CM | POA: Diagnosis not present

## 2022-05-21 DIAGNOSIS — M25512 Pain in left shoulder: Secondary | ICD-10-CM | POA: Diagnosis not present

## 2022-05-25 DIAGNOSIS — M25512 Pain in left shoulder: Secondary | ICD-10-CM | POA: Diagnosis not present

## 2022-05-28 DIAGNOSIS — M25512 Pain in left shoulder: Secondary | ICD-10-CM | POA: Diagnosis not present

## 2022-05-31 ENCOUNTER — Encounter: Payer: Self-pay | Admitting: Neurology

## 2022-06-01 DIAGNOSIS — M25512 Pain in left shoulder: Secondary | ICD-10-CM | POA: Diagnosis not present

## 2022-06-02 ENCOUNTER — Ambulatory Visit
Admission: RE | Admit: 2022-06-02 | Discharge: 2022-06-02 | Disposition: A | Discharge status: Home / Self Care | Payer: Medicare HMO | Source: Ambulatory Visit | Attending: Neurology | Admitting: Neurology

## 2022-06-02 DIAGNOSIS — M5136 Other intervertebral disc degeneration, lumbar region: Secondary | ICD-10-CM

## 2022-06-02 DIAGNOSIS — R269 Unspecified abnormalities of gait and mobility: Secondary | ICD-10-CM | POA: Diagnosis not present

## 2022-06-02 DIAGNOSIS — M5416 Radiculopathy, lumbar region: Secondary | ICD-10-CM | POA: Diagnosis not present

## 2022-06-02 DIAGNOSIS — M542 Cervicalgia: Secondary | ICD-10-CM

## 2022-06-02 DIAGNOSIS — R519 Headache, unspecified: Secondary | ICD-10-CM | POA: Diagnosis not present

## 2022-06-03 ENCOUNTER — Telehealth: Payer: Self-pay | Admitting: Neurology

## 2022-06-03 DIAGNOSIS — R269 Unspecified abnormalities of gait and mobility: Secondary | ICD-10-CM

## 2022-06-03 DIAGNOSIS — G959 Disease of spinal cord, unspecified: Secondary | ICD-10-CM | POA: Insufficient documentation

## 2022-06-03 NOTE — Telephone Encounter (Signed)
Referral sent to [facility], phone # [phone]. 

## 2022-06-03 NOTE — Telephone Encounter (Signed)
Please call patient, MRI of the cervical spine showed severe spinal stenosis at C5-6, more towards the left side, evidence of cord compression, severe foraminal narrowing  MRI of the lumbar spine showed postsurgical change L4-5, L3-4 at the level above and L5-S1 below surgical level showed prominent spondylitic change, with moderate foraminal narrowing,  I will refer him to neurosurgeon for evaluation, if he has question about MRI findings, may put him on schedule for Monday March 4th at 1 PM,   IMPRESSION: MRI scan cervical spine without contrast showing prominent spondylitic changes at C5-6 with left paracentral disc osteophyte protrusion resulting in cord compression with severe bilateral  foraminal narrowing.  C4-5 also shows prominent degenerative change resulting in mild canal and severe right and moderate left-sided foraminal narrowing.     IMPRESSION: MRI scan lumbar spine without contrast showing stable changes of postop L4-5 posterior fusion with prominent spondylitic changes at L3-4 with severe left moderate left right-sided foraminal narrowing.  L5-S1 also shows prominent spondylitic change with disc osteophyte protrusion resulting in moderate bilateral foraminal narrowing.  Overall these changes appear not significantly progressed compared with previous MRI from 01/08/2021   Orders Placed This Encounter  Procedures   Ambulatory referral to Neurosurgery

## 2022-06-03 NOTE — Telephone Encounter (Signed)
Called and spoke to patient about results, patient would like to further discuss this on Monday. Patient scheduled for Monday 03.04.24 at 1300

## 2022-06-04 DIAGNOSIS — M25512 Pain in left shoulder: Secondary | ICD-10-CM | POA: Diagnosis not present

## 2022-06-07 ENCOUNTER — Ambulatory Visit: Payer: Medicare HMO | Admitting: Neurology

## 2022-06-07 ENCOUNTER — Encounter: Payer: Self-pay | Admitting: Neurology

## 2022-06-07 ENCOUNTER — Telehealth: Payer: Self-pay | Admitting: Neurology

## 2022-06-07 VITALS — BP 142/71 | HR 72 | Ht 71.0 in | Wt 214.0 lb

## 2022-06-07 DIAGNOSIS — M5416 Radiculopathy, lumbar region: Secondary | ICD-10-CM | POA: Insufficient documentation

## 2022-06-07 DIAGNOSIS — Z6830 Body mass index (BMI) 30.0-30.9, adult: Secondary | ICD-10-CM | POA: Diagnosis not present

## 2022-06-07 DIAGNOSIS — G959 Disease of spinal cord, unspecified: Secondary | ICD-10-CM | POA: Diagnosis not present

## 2022-06-07 DIAGNOSIS — M5136 Other intervertebral disc degeneration, lumbar region: Secondary | ICD-10-CM

## 2022-06-07 DIAGNOSIS — R269 Unspecified abnormalities of gait and mobility: Secondary | ICD-10-CM

## 2022-06-07 NOTE — Progress Notes (Addendum)
Chief Complaint  Patient presents with   Follow-up    Rm 14. Alone. He reports no changes to balance. He is exercising on a treadmill 3x a week for 20-30 minutes at a time. Here to follow up on MRI results.      ASSESSMENT AND PLAN  Ricardo [name] is a 82 y.o. male   Gait abnormality Frequent calf muscle cramping  History of L4-5 fusion in February 2022, right lumbar radiculopathy, which has been helpful,  Significant abnormal MRI of cervical spine, severe canal stenosis at C5-6, evidence of hyperreflexia suggestive of cervical myelopathy on examination,  L5-S1 shoulders prominent spondylitic changes, osteophyte protruding with facet hypertrophy with moderate bilateral foraminal narrowing, L3-4 also with severe left and moderate right foraminal narrowing, which can explain his current lower extremity muscle cramping  Neurosurgery evaluation by Dr. Ellene Route pending today on June 07, 2022,  Will also see him in May for EMG nerve conduction study  DIAGNOSTIC DATA (LABS, IMAGING, TESTING) - I reviewed patient records, labs, notes, testing and imaging myself where available. Addendum, neurosurgery evaluation on June 07, 2022 by Dr. Ellene Route, plan to have cervical decompression,  MEDICAL HISTORY:  Ricardo [name] is a 82 year old male, seen in request by his primary care physician Dr. Leanna Battles for evaluation of gait abnormality, initial evaluation was on May 10, 2022  I reviewed and summarized the referring note. PMHX HTN Depression,  DM OSA-compliance with his CPAP  Lumbar decompression surgery in Feb 2022.  He presented with his right lumbar radicular pain, was seen by Dr. Ellene Route, spondylolisthesis L4-L5 with chronic radiculopathy, neurogenic claudication.eventually underwent decompressive fusion of L4-5 in February 2022, which has helped his right low back pain radiating pain to right lower extremity  However shortly after surgery, he began to noticed left leg discomfort,  stretching numbing sensation at the posterior left leg, calf, to his knee,  I was able to review postsurgical MRI lumbar in October 2022: Postsurgical changes at L4-5 with interval resolution of spinal canal stenosis at this level.Mild progression of spinal canal stenosis at L3-4, now mild to moderate with narrowing of the bilateral subarticular zones.Resolution of the superiorly migrating left subarticular disc extrusion at L5-S1 with persistent protruded disc causing mass effect on the traversing left S1 nerve root.  Per patient, he was re-evaluated by Dr. Sheppard Coil, decided conservative treatment,  Over the past couple years, he complains gradual worsening balance issues, not so confident while walking, especially at nighttime, he noticed frequent muscle cramping involving both legs, wake him up almost every night, he has to do forceful leg stretching to relieve the discomfort, occasionally have to get up in the middle of the night, tried muscle relaxant without helping his symptoms  He denies bowel or bladder incontinence, no hands paresthesia, does have neck pain at posterior cervical region  UPDATE March 4th 2024: He came in office to review his MRI findings, complains of mild balance issues, no falling, no significant pain, neck making cracking sound when turning, wake up almost every night early morning complains of bilateral lower extremity muscle cramping, stretching would help, previously tried gabapentin, worried about the side effect including double vision, has not tried this time  He denies trouble control bowel or bladder, had left rotator cuff surgery in November 2023, going through some physical therapy We personally reviewed MRI of cervical spine in February 2024, prominent spondylitic change at C5-6, with left paracentral disc osteophyte protrusion with cord compression, severe biforaminal narrowing, C4-5 showed severe right  moderate left foraminal narrowing MRI of the lumbar spine  postsurgical changes of L4-5 posterior fusion with significant spondylitic change at L5-S1, disc osteophyte, more towards the left side, with severe left foraminal stenosis,   PHYSICAL EXAM:   Vitals:   06/07/22 1255  BP: (!) 142/71  Pulse: 72  Weight: 214 lb (97.1 kg)  Height: '5\' 11"'$  (1.803 m)      Body mass index is 29.85 kg/m.  PHYSICAL EXAMNIATION:  Gen: NAD, conversant, well nourised, well groomed                     Cardiovascular: Regular rate rhythm, no peripheral edema, warm, nontender. Eyes: Conjunctivae clear without exudates or hemorrhage Neck: Supple, no carotid bruits. Pulmonary: Clear to auscultation bilaterally   NEUROLOGICAL EXAM:  MENTAL STATUS: Speech/cognition: Awake, alert, oriented to history taking and casual conversation CRANIAL NERVES: CN II: Visual fields are full to confrontation. Pupils are round equal and briskly reactive to light. CN III, IV, VI: extraocular movement are normal. No ptosis. CN V: Facial sensation is intact to light touch CN VII: Face is symmetric with normal eye closure  CN VIII: Hearing is normal to causal conversation. CN IX, X: Phonation is normal. CN XI: Head turning and shoulder shrug are intact  MOTOR: Limited left upper extremity distress due to left shoulder pain,  REFLEXES: Reflexes are 2+ and symmetric at the biceps, triceps, 3/3 at knees, and ankles. Plantar responses are extensor bilaterally.  SENSORY: Intact to light touch, pinprick and vibratory sensation are intact in fingers and toes.  COORDINATION: There is no trunk or limb dysmetria noted.  GAIT/STANCE: He can get up from seated position arm crossed, wide-based, cautious, mildly positive Romberg signs, able to stand up on tiptoes and heels  REVIEW OF SYSTEMS:  Full 14 system review of systems performed and notable only for as above All other review of systems were negative.   ALLERGIES: No Known Allergies  HOME MEDICATIONS: Current Outpatient  Medications  Medication Sig Dispense Refill   acetaminophen (TYLENOL) 650 MG CR tablet Take by mouth every 8 (eight) hours. 1 1/2 every 8 hours     amLODipine (NORVASC) 5 MG tablet Take 5 mg by mouth daily.     Ascorbic Acid (VITAMIN C) 500 MG CAPS Take 500 mg by mouth daily.     aspirin 81 MG tablet Take 81 mg by mouth daily. Reported on 07/07/2015     cholecalciferol (VITAMIN D3) 25 MCG (1000 UNIT) tablet Take 2,000 Units by mouth daily.     escitalopram (LEXAPRO) 10 MG tablet Take 10 mg by mouth daily.     irbesartan-hydrochlorothiazide (AVALIDE) 300-12.5 MG tablet Take 0.5 tablets by mouth 2 (two) times daily.     metFORMIN (GLUCOPHAGE-XR) 500 MG 24 hr tablet Take 500 mg by mouth daily.     Multiple Vitamin (MULTIVITAMIN) tablet Take 1 tablet by mouth daily.     Naproxen Sodium 220 MG CAPS Take 220 mg by mouth in the morning and at bedtime.     No current facility-administered medications for this visit.    PAST MEDICAL HISTORY: Past Medical History:  Diagnosis Date   Allergic rhinitis 05/24/2007   Qualifier: Diagnosis of  By: [name] RN, [name]     Chest pain    Cough 07/13/2016   Depression    Diabetes mellitus without complication ([place])    Dyspnea    Dysrhythmia    Falls frequently    Hemorrhoids    History of  pneumonia    Hx of colonic polyps 10/31/2002   Hyperplastic    Hypertension    Leg cramps    Numbness and tingling in both hands    Obstructive sleep apnea    CPAP   Peptic ulcer    Syncope    Tachycardia     PAST SURGICAL HISTORY: Past Surgical History:  Procedure Laterality Date   APPENDECTOMY  1957    FAMILY HISTORY: Family History  Problem Relation Age of Onset   Stroke Father    Hypertension Mother    Pancreatic cancer Mother    2 Brother        AAA   Anuerysm Father        AAA    SOCIAL HISTORY: Social History   Socioeconomic History   Marital status: Married    Spouse name: Not on file   Number of children: 3   Years of  education: Not on file   Highest education level: Not on file  Occupational History   Occupation: Chief Financial Officer    Employer: [employer]  Tobacco Use   Smoking status: Former    Types: Pipe    Quit date: 04/05/1988    Years since quitting: 34.1   Smokeless tobacco: Never  Substance and Sexual Activity   Alcohol use: Yes    Comment: occasionally   Drug use: No   Sexual activity: Not on file  Other Topics Concern   Not on file  Social History Narrative   Not on file   Social Determinants of Health   Financial Resource Strain: Not on file  Food Insecurity: Not on file  Transportation Needs: Not on file  Physical Activity: Not on file  Stress: Not on file  Social Connections: Not on file  Intimate Partner Violence: Not on file      Marcial Pacas, M.D. Ph.D.  [facility] [address] [place], [place] [address] Ph: [phone] Fax: [phone]  CC:  Donnajean Lopes, MD [address] [place],  [place] [address]  Donnajean Lopes, MD

## 2022-06-07 NOTE — Telephone Encounter (Signed)
Will discuss with patient mri findings on March 4th visit.

## 2022-06-08 DIAGNOSIS — M25512 Pain in left shoulder: Secondary | ICD-10-CM | POA: Diagnosis not present

## 2022-06-11 DIAGNOSIS — M25512 Pain in left shoulder: Secondary | ICD-10-CM | POA: Diagnosis not present

## 2022-06-14 ENCOUNTER — Telehealth: Payer: Self-pay | Admitting: Neurology

## 2022-06-14 NOTE — Telephone Encounter (Signed)
Pt states his neurosurgeon (Dr Ellene Route) has suggested moving forward with surgery before the scheduled NCS, pt would like to know what Dr Krista Blue thinks about that, please call to discuss.

## 2022-06-14 NOTE — Telephone Encounter (Signed)
Ok to proceed with surgery, Cancel EMG appt, change to follow up around the same time frame

## 2022-06-15 DIAGNOSIS — M25512 Pain in left shoulder: Secondary | ICD-10-CM | POA: Diagnosis not present

## 2022-06-18 DIAGNOSIS — M25512 Pain in left shoulder: Secondary | ICD-10-CM | POA: Diagnosis not present

## 2022-06-22 DIAGNOSIS — M25512 Pain in left shoulder: Secondary | ICD-10-CM | POA: Diagnosis not present

## 2022-06-29 DIAGNOSIS — M25512 Pain in left shoulder: Secondary | ICD-10-CM | POA: Diagnosis not present

## 2022-07-02 DIAGNOSIS — M25512 Pain in left shoulder: Secondary | ICD-10-CM | POA: Diagnosis not present

## 2022-07-14 DIAGNOSIS — Z4789 Encounter for other orthopedic aftercare: Secondary | ICD-10-CM | POA: Diagnosis not present

## 2022-07-14 DIAGNOSIS — G4733 Obstructive sleep apnea (adult) (pediatric): Secondary | ICD-10-CM | POA: Diagnosis not present

## 2022-08-09 ENCOUNTER — Encounter: Payer: Self-pay | Admitting: Cardiovascular Disease

## 2022-08-09 NOTE — Progress Notes (Unsigned)
Cardiology Office Note:    Date:  08/11/2022   ID:  Ricardo [name], DOB [birth year], MRN [id number]  PCP:  Ricardo Fillers, MD  [facility] Cardiologist:  Rosellen Lichtenberger  [facility] Electrophysiologist:  None   Referring MD: Ricardo Fillers, MD   Chief Complaint  Patient presents with   Hypertension         History of Present Illness:    Ricardo [name] is a 82 y.o. male with a hx of dyspnea..   I last saw him around 2010. We were asked to see him again by Dr. Eloise Harman for further evaluation of progressive dyspena  He has been having more shortness of breath and fatigue with exertion .  No CP or dyspnea Getting some regular exercise,  Issues - balance is off - some of this issue sounds like orthostatic hypotension .  Most of his balance issues occur after he has been standing .   Difficult for him to walk heel-to-toe  -  Has leg cramps at night .  Relieved with stretching his legs   July 20, 2021 Ricardo [name] is seen for follow up  He back surgery a year ago, Had surgery but now has some issues with the other leg  BP is good tday  Dr. Eloise Harman added amlodipine    Aug 10, 2022 Ricardo [name] is seen for follow up of his HTN May need to have neck surgery of C5 and C6 Has compression  Walks treadmill 3 times a week.   15 min  No cp or dyspnea .    He is at low risk for his upcoming neck surgery . Ok to hold ASA for 5-7 days prior to surgery          Past Medical History:  Diagnosis Date   Allergic rhinitis 05/24/2007   Qualifier: Diagnosis of  By: Laural Benes RN, [name]     Chest pain    Cough 07/13/2016   Depression    Diabetes mellitus without complication (HCC)    Dyspnea    Dysrhythmia    Falls frequently    Hemorrhoids    History of pneumonia    Hx of colonic polyps 10/31/2002   Hyperplastic    Hypertension    Leg cramps    Numbness and tingling in both hands    Obstructive sleep apnea    CPAP   Peptic ulcer    Syncope    Tachycardia     Past Surgical History:   Procedure Laterality Date   APPENDECTOMY  1957    Current Medications: Current Meds  Medication Sig   acetaminophen (TYLENOL) 650 MG CR tablet Take by mouth every 8 (eight) hours. 1 1/2 every 8 hours   amLODipine (NORVASC) 5 MG tablet Take 5 mg by mouth daily.   Ascorbic Acid (VITAMIN C) 500 MG CAPS Take 500 mg by mouth daily.   aspirin 81 MG tablet Take 81 mg by mouth daily. Reported on 07/07/2015   cholecalciferol (VITAMIN D3) 25 MCG (1000 UNIT) tablet Take 2,000 Units by mouth daily.   escitalopram (LEXAPRO) 10 MG tablet Take 10 mg by mouth daily.   irbesartan-hydrochlorothiazide (AVALIDE) 300-12.5 MG tablet Take 0.5 tablets by mouth 2 (two) times daily.   metFORMIN (GLUCOPHAGE-XR) 500 MG 24 hr tablet Take 500 mg by mouth daily.   Multiple Vitamin (MULTIVITAMIN) tablet Take 1 tablet by mouth daily.   Naproxen Sodium 220 MG CAPS Take 220 mg by mouth in the morning and at bedtime.  Allergies:   Patient has no known allergies.   Social History   Socioeconomic History   Marital status: Married    Spouse name: Not on file   Number of children: 3   Years of education: Not on file   Highest education level: Not on file  Occupational History   Occupation: Art gallery manager    Employer: [employer]  Tobacco Use   Smoking status: Former    Types: Pipe    Quit date: 04/05/1988    Years since quitting: 34.3   Smokeless tobacco: Never  Substance and Sexual Activity   Alcohol use: Yes    Comment: occasionally   Drug use: No   Sexual activity: Not on file  Other Topics Concern   Not on file  Social History Narrative   Not on file   Social Determinants of Health   Financial Resource Strain: Not on file  Food Insecurity: Not on file  Transportation Needs: Not on file  Physical Activity: Not on file  Stress: Not on file  Social Connections: Not on file     Family History: The patient's family history includes Anuerysm in his brother and father; Hypertension in his mother;  Pancreatic cancer in his mother; Stroke in his father.  ROS:   Please see the history of present illness.     All other systems reviewed and are negative.  EKGs/Labs/Other Studies Reviewed:    The following studies were reviewed today:    Recent Labs: No results found for requested labs within last 365 days.  Recent Lipid Panel No results found for: "CHOL", "TRIG", "HDL", "CHOLHDL", "VLDL", "LDLCALC", "LDLDIRECT"  Physical Exam:    Physical Exam: Blood pressure 124/70, pulse 61, height 5\' 10"  (1.778 m), weight 211 lb (95.7 kg), SpO2 94 %.       GEN:  Well nourished, well developed in no acute distress HEENT: Normal NECK: No JVD; No carotid bruits LYMPHATICS: No lymphadenopathy CARDIAC: RRR , no murmurs, rubs, gallops RESPIRATORY:  Clear to auscultation without rales, wheezing or rhonchi  ABDOMEN: Soft, non-tender, non-distended MUSCULOSKELETAL:  No edema; No deformity  SKIN: Warm and dry NEUROLOGIC:  Alert and oriented x 3    ECG:    Aug 11, 2022   NSR - at 42,   RBBB with associated ST / T wave changes.   Diagnoses:    1. Primary hypertension      Assessment and Plan:    1.  HTN:      2.  Pre op for neck surgery .  Ricardo [name] is at low risk for his neck surgery.  He may hold his ASA for 5-7 days prior to his surgery    BP is well controlled.  No CP , no dyspnea.    Will see him in 1 year       Medication Adjustments/Labs and Tests Ordered: Current medicines are reviewed at length with the patient today.  Concerns regarding medicines are outlined above.  No orders of the defined types were placed in this encounter.  No orders of the defined types were placed in this encounter.   Patient Instructions  Medication Instructions:  Your physician recommends that you continue on your current medications as directed. Please refer to the Current Medication list given to you today.  *If you need a refill on your cardiac medications before your next appointment,  please call your pharmacy*   Lab Work: NONE If you have labs (blood work) drawn today and your tests are completely normal,  you will receive your results only by: MyChart Message (if you have MyChart) OR A paper copy in the mail If you have any lab test that is abnormal or we need to change your treatment, we will call you to review the results.   Testing/Procedures: NONE   Follow-Up: At [facility], you and your health needs are our priority.  As part of our continuing mission to provide you with exceptional heart care, we have created designated Provider Care Teams.  These Care Teams include your primary Cardiologist (physician) and Advanced Practice Providers (APPs -  Physician Assistants and Nurse Practitioners) who all work together to provide you with the care you need, when you need it.  We recommend signing up for the patient portal called "MyChart".  Sign up information is provided on this After Visit Summary.  MyChart is used to connect with patients for Virtual Visits (Telemedicine).  Patients are able to view lab/test results, encounter notes, upcoming appointments, etc.  Non-urgent messages can be sent to your provider as well.   To learn more about what you can do with MyChart, go to [email or web address].    Your next appointment:   1 year(s)  Provider:   Kristeen Miss, MD        Signed, Kristeen Miss, MD  08/11/2022 8:39 AM    [facility]

## 2022-08-11 ENCOUNTER — Ambulatory Visit: Payer: Medicare HMO | Attending: Cardiovascular Disease | Admitting: Cardiovascular Disease

## 2022-08-11 ENCOUNTER — Encounter: Payer: Medicare HMO | Admitting: Neurology

## 2022-08-11 ENCOUNTER — Encounter: Payer: Self-pay | Admitting: Cardiovascular Disease

## 2022-08-11 VITALS — BP 124/70 | HR 61 | Ht 70.0 in | Wt 211.0 lb

## 2022-08-11 DIAGNOSIS — I10 Essential (primary) hypertension: Secondary | ICD-10-CM

## 2022-08-11 NOTE — Addendum Note (Signed)
Addended by: Winifred Olive on: 08/11/2022 08:51 AM   Modules accepted: Orders

## 2022-08-11 NOTE — Patient Instructions (Signed)
Medication Instructions:  Your physician recommends that you continue on your current medications as directed. Please refer to the Current Medication list given to you today.  *If you need a refill on your cardiac medications before your next appointment, please call your pharmacy*   Lab Work: NONE If you have labs (blood work) drawn today and your tests are completely normal, you will receive your results only by: MyChart Message (if you have MyChart) OR A paper copy in the mail If you have any lab test that is abnormal or we need to change your treatment, we will call you to review the results.   Testing/Procedures: NONE   Follow-Up: At [facility], you and your health needs are our priority.  As part of our continuing mission to provide you with exceptional heart care, we have created designated Provider Care Teams.  These Care Teams include your primary Cardiologist (physician) and Advanced Practice Providers (APPs -  Physician Assistants and Nurse Practitioners) who all work together to provide you with the care you need, when you need it.  We recommend signing up for the patient portal called "MyChart".  Sign up information is provided on this After Visit Summary.  MyChart is used to connect with patients for Virtual Visits (Telemedicine).  Patients are able to view lab/test results, encounter notes, upcoming appointments, etc.  Non-urgent messages can be sent to your provider as well.   To learn more about what you can do with MyChart, go to [email or web address].    Your next appointment:   1 year(s)  Provider:   Philip Nahser, MD      

## 2022-08-12 DIAGNOSIS — D225 Melanocytic nevi of trunk: Secondary | ICD-10-CM | POA: Diagnosis not present

## 2022-08-12 DIAGNOSIS — L570 Actinic keratosis: Secondary | ICD-10-CM | POA: Diagnosis not present

## 2022-08-12 DIAGNOSIS — L905 Scar conditions and fibrosis of skin: Secondary | ICD-10-CM | POA: Diagnosis not present

## 2022-08-12 DIAGNOSIS — D1801 Hemangioma of skin and subcutaneous tissue: Secondary | ICD-10-CM | POA: Diagnosis not present

## 2022-08-12 DIAGNOSIS — Z85828 Personal history of other malignant neoplasm of skin: Secondary | ICD-10-CM | POA: Diagnosis not present

## 2022-08-12 DIAGNOSIS — L821 Other seborrheic keratosis: Secondary | ICD-10-CM | POA: Diagnosis not present

## 2022-08-12 DIAGNOSIS — D2272 Melanocytic nevi of left lower limb, including hip: Secondary | ICD-10-CM | POA: Diagnosis not present

## 2022-08-12 DIAGNOSIS — Z85820 Personal history of malignant melanoma of skin: Secondary | ICD-10-CM | POA: Diagnosis not present

## 2022-08-18 ENCOUNTER — Encounter: Payer: Medicare HMO | Admitting: Neurology

## 2022-09-01 DIAGNOSIS — G959 Disease of spinal cord, unspecified: Secondary | ICD-10-CM | POA: Diagnosis not present

## 2022-09-01 DIAGNOSIS — Z6830 Body mass index (BMI) 30.0-30.9, adult: Secondary | ICD-10-CM | POA: Diagnosis not present

## 2022-09-14 DIAGNOSIS — I10 Essential (primary) hypertension: Secondary | ICD-10-CM | POA: Diagnosis not present

## 2022-09-14 DIAGNOSIS — G4733 Obstructive sleep apnea (adult) (pediatric): Secondary | ICD-10-CM | POA: Diagnosis not present

## 2022-09-14 DIAGNOSIS — G959 Disease of spinal cord, unspecified: Secondary | ICD-10-CM | POA: Diagnosis not present

## 2022-09-14 DIAGNOSIS — E1151 Type 2 diabetes mellitus with diabetic peripheral angiopathy without gangrene: Secondary | ICD-10-CM | POA: Diagnosis not present

## 2022-09-14 DIAGNOSIS — I739 Peripheral vascular disease, unspecified: Secondary | ICD-10-CM | POA: Diagnosis not present

## 2022-09-14 DIAGNOSIS — E785 Hyperlipidemia, unspecified: Secondary | ICD-10-CM | POA: Diagnosis not present

## 2022-10-14 ENCOUNTER — Ambulatory Visit: Payer: Medicare HMO | Admitting: Neurology

## 2022-10-14 DIAGNOSIS — M50121 Cervical disc disorder at C4-C5 level with radiculopathy: Secondary | ICD-10-CM | POA: Diagnosis not present

## 2022-10-14 DIAGNOSIS — M50022 Cervical disc disorder at C5-C6 level with myelopathy: Secondary | ICD-10-CM | POA: Diagnosis not present

## 2022-10-14 DIAGNOSIS — M50021 Cervical disc disorder at C4-C5 level with myelopathy: Secondary | ICD-10-CM | POA: Diagnosis not present

## 2022-10-14 DIAGNOSIS — M50122 Cervical disc disorder at C5-C6 level with radiculopathy: Secondary | ICD-10-CM | POA: Diagnosis not present

## 2022-10-14 DIAGNOSIS — M4722 Other spondylosis with radiculopathy, cervical region: Secondary | ICD-10-CM | POA: Diagnosis not present

## 2022-10-14 DIAGNOSIS — M4712 Other spondylosis with myelopathy, cervical region: Secondary | ICD-10-CM | POA: Diagnosis not present

## 2022-11-05 DIAGNOSIS — G959 Disease of spinal cord, unspecified: Secondary | ICD-10-CM | POA: Diagnosis not present

## 2022-11-17 DIAGNOSIS — D3131 Benign neoplasm of right choroid: Secondary | ICD-10-CM | POA: Diagnosis not present

## 2022-11-17 DIAGNOSIS — H52202 Unspecified astigmatism, left eye: Secondary | ICD-10-CM | POA: Diagnosis not present

## 2022-11-17 DIAGNOSIS — E119 Type 2 diabetes mellitus without complications: Secondary | ICD-10-CM | POA: Diagnosis not present

## 2022-11-17 DIAGNOSIS — Z961 Presence of intraocular lens: Secondary | ICD-10-CM | POA: Diagnosis not present

## 2022-11-17 DIAGNOSIS — H524 Presbyopia: Secondary | ICD-10-CM | POA: Diagnosis not present

## 2022-11-23 ENCOUNTER — Encounter: Payer: Self-pay | Admitting: Neurology

## 2022-11-23 ENCOUNTER — Ambulatory Visit: Payer: Medicare HMO | Admitting: Neurology

## 2022-11-23 VITALS — BP 116/76 | Ht 70.0 in | Wt 207.0 lb

## 2022-11-23 DIAGNOSIS — M5136 Other intervertebral disc degeneration, lumbar region: Secondary | ICD-10-CM | POA: Diagnosis not present

## 2022-11-23 DIAGNOSIS — G959 Disease of spinal cord, unspecified: Secondary | ICD-10-CM | POA: Diagnosis not present

## 2022-11-23 NOTE — Progress Notes (Signed)
Chief Complaint  Patient presents with   Follow-up    Rm 15, alone, f/u, had cervical spine surgery on 7/11, doing better he thinks, no other changes      ASSESSMENT AND PLAN  Ricardo [name] is a 82 y.o. male   Cervical myelopathy  With severe canal stenosis at C5-6, status post ACDF by Dr. Danielle Dess on October 14, 2022 recovered very well History of lumbar decompression surgery,  Frequent bilateral lower extremity, frequent lower extremity especially on the left side, improved since cervical decompression surgery  MRI of lumbar February 2024 showed postoperative changes L4-5 posterior fusion with prominent spondylitic changes at L3-4, severe left foraminal stenosis,   Is overall doing very well, follow-up with primary care only return to clinic for new issues  DIAGNOSTIC DATA (LABS, IMAGING, TESTING) - I reviewed patient records, labs, notes, testing and imaging myself where available. Addendum, neurosurgery evaluation on June 07, 2022 by Dr. Danielle Dess, plan to have cervical decompression,  MEDICAL HISTORY:  Ricardo [name] is a 81 year old male, seen in request by his primary care physician Dr. Jarome Matin for evaluation of gait abnormality, initial evaluation was on May 10, 2022  I reviewed and summarized the referring note. PMHX HTN Depression,  DM OSA-compliance with his CPAP  Lumbar decompression surgery in Feb 2022.  He presented with his right lumbar radicular pain, was seen by Dr. Danielle Dess, spondylolisthesis L4-L5 with chronic radiculopathy, neurogenic claudication.eventually underwent decompressive fusion of L4-5 in February 2022, which has helped his right low back pain radiating pain to right lower extremity  However shortly after surgery, he began to noticed left leg discomfort, stretching numbing sensation at the posterior left leg, calf, to his knee,  I was able to review postsurgical MRI lumbar in October 2022: Postsurgical changes at L4-5 with interval  resolution of spinal canal stenosis at this level.Mild progression of spinal canal stenosis at L3-4, now mild to moderate with narrowing of the bilateral subarticular zones.Resolution of the superiorly migrating left subarticular disc extrusion at L5-S1 with persistent protruded disc causing mass effect on the traversing left S1 nerve root.  Per patient, he was re-evaluated by Dr. Danielle Dess, decided conservative treatment,  Over the past couple years, he complains gradual worsening balance issues, not so confident while walking, especially at nighttime, he noticed frequent muscle cramping involving both legs, wake him up almost every night, he has to do forceful leg stretching to relieve the discomfort, occasionally have to get up in the middle of the night, tried muscle relaxant without helping his symptoms  He denies bowel or bladder incontinence, no hands paresthesia, does have neck pain at posterior cervical region  UPDATE March 4th 2024: He came in office to review his MRI findings, complains of mild balance issues, no falling, no significant pain, neck making cracking sound when turning, wake up almost every night early morning complains of bilateral lower extremity muscle cramping, stretching would help, previously tried gabapentin, worried about the side effect including double vision, has not tried this time  He denies trouble control bowel or bladder, had left rotator cuff surgery in November 2023, going through some physical therapy We personally reviewed MRI of cervical spine in February 2024, prominent spondylitic change at C5-6, with left paracentral disc osteophyte protrusion with cord compression, severe biforaminal narrowing, C4-5 showed severe right moderate left foraminal narrowing MRI of the lumbar spine postsurgical changes of L4-5 posterior fusion with significant spondylitic change at L5-S1, disc osteophyte, more towards the left side, with severe  left foraminal stenosis,  UPDATE  November 23 2022: He underwent ACDF C5-6 by Dr. Danielle Dess on October 14, 2022, recovered very well, noticed improvement in his balance, less cramping of bilateral leg at nighttime, sleep much better, but still has mild degree almost every night, previously tried gabapentin complains of double vision, does not want to try it again  He is active, able to do yard work without much difficulty,  PHYSICAL EXAM:   Vitals:   11/23/22 0845  BP: 116/76  Weight: 207 lb (93.9 kg)  Height: 5\' 10"  (1.778 m)      Body mass index is 29.7 kg/m.  PHYSICAL EXAMNIATION:  Gen: NAD, conversant, well nourised, well groomed                     Cardiovascular: Regular rate rhythm, no peripheral edema, warm, nontender. Eyes: Conjunctivae clear without exudates or hemorrhage Neck: Supple, no carotid bruits. Pulmonary: Clear to auscultation bilaterally   NEUROLOGICAL EXAM:  MENTAL STATUS: Speech/cognition: Awake, alert, oriented to history taking and casual conversation CRANIAL NERVES: CN II: Visual fields are full to confrontation. Pupils are round equal and briskly reactive to light. CN III, IV, VI: extraocular movement are normal. No ptosis. CN V: Facial sensation is intact to light touch CN VII: Face is symmetric with normal eye closure  CN VIII: Hearing is normal to causal conversation. CN IX, X: Phonation is normal. CN XI: Head turning and shoulder shrug are intact  MOTOR: Limited left upper extremity distress due to left shoulder pain,  REFLEXES: Reflexes are 2+ and symmetric at the biceps, triceps, 3/3 at knees, and trace at ankles. Plantar responses are extensor bilaterally.  SENSORY: Intact to light touch, pinprick and vibratory sensation are intact in fingers and toes.  COORDINATION: There is no trunk or limb dysmetria noted.  GAIT/STANCE: He can get up from seated position arm crossed, steady  REVIEW OF SYSTEMS:  Full 14 system review of systems performed and notable only for as  above All other review of systems were negative.   ALLERGIES: No Known Allergies  HOME MEDICATIONS: Current Outpatient Medications  Medication Sig Dispense Refill   acetaminophen (TYLENOL) 650 MG CR tablet Take by mouth every 8 (eight) hours. 1 1/2 every 8 hours     amLODipine (NORVASC) 5 MG tablet Take 5 mg by mouth daily.     Ascorbic Acid (VITAMIN C) 500 MG CAPS Take 500 mg by mouth daily.     aspirin 81 MG tablet Take 81 mg by mouth daily. Reported on 07/07/2015     cholecalciferol (VITAMIN D3) 25 MCG (1000 UNIT) tablet Take 2,000 Units by mouth daily.     escitalopram (LEXAPRO) 10 MG tablet Take 10 mg by mouth daily.     irbesartan-hydrochlorothiazide (AVALIDE) 300-12.5 MG tablet Take 0.5 tablets by mouth 2 (two) times daily.     metFORMIN (GLUCOPHAGE-XR) 500 MG 24 hr tablet Take 500 mg by mouth daily.     Multiple Vitamin (MULTIVITAMIN) tablet Take 1 tablet by mouth daily.     Naproxen Sodium 220 MG CAPS Take 220 mg by mouth in the morning and at bedtime.     No current facility-administered medications for this visit.    PAST MEDICAL HISTORY: Past Medical History:  Diagnosis Date   Allergic rhinitis 05/24/2007   Qualifier: Diagnosis of  By: Laural Benes RN, [name]     Chest pain    Cough 07/13/2016   Depression    Diabetes mellitus without complication (HCC)  Dyspnea    Dysrhythmia    Falls frequently    Hemorrhoids    History of pneumonia    Hx of colonic polyps 10/31/2002   Hyperplastic    Hypertension    Leg cramps    Numbness and tingling in both hands    Obstructive sleep apnea    CPAP   Peptic ulcer    Syncope    Tachycardia     PAST SURGICAL HISTORY: Past Surgical History:  Procedure Laterality Date   APPENDECTOMY  04/06/1955   CERVICAL FUSION     C4-5, C5-6    FAMILY HISTORY: Family History  Problem Relation Age of Onset   Stroke Father    Hypertension Mother    Pancreatic cancer Mother    Anuerysm Brother        AAA   Anuerysm Father         AAA    SOCIAL HISTORY: Social History   Socioeconomic History   Marital status: Married    Spouse name: Not on file   Number of children: 3   Years of education: Not on file   Highest education level: Not on file  Occupational History   Occupation: Garment/textile technologist: [employer]  Tobacco Use   Smoking status: Former    Types: Pipe    Quit date: 04/05/1988    Years since quitting: 34.6   Smokeless tobacco: Never  Substance and Sexual Activity   Alcohol use: Yes    Comment: occasionally   Drug use: No   Sexual activity: Not on file  Other Topics Concern   Not on file  Social History Narrative   Right handed   Caffeine-1 cup daily   Lives with wife   Social Determinants of Health   Financial Resource Strain: Not on file  Food Insecurity: Not on file  Transportation Needs: Not on file  Physical Activity: Not on file  Stress: Not on file  Social Connections: Unknown (08/18/2021)   Received from [facility]   Social Network    Social Network: Not on file  Intimate Partner Violence: Unknown (07/10/2021)   Received from [facility], [facility]   HITS    Physically Hurt: Not on file    Insult or Talk Down To: Not on file    Threaten Physical Harm: Not on file    Scream or Curse: Not on file      Levert Feinstein, M.D. Ph.D.  [facility] [address], [address] [place], [place] [address] Ph: [phone] Fax: [phone]  CC:  Garlan Fillers, MD [address] [place],  [place] [address]  Garlan Fillers, MD

## 2022-11-30 DIAGNOSIS — G4733 Obstructive sleep apnea (adult) (pediatric): Secondary | ICD-10-CM | POA: Diagnosis not present

## 2022-12-27 ENCOUNTER — Ambulatory Visit: Payer: Medicare HMO | Admitting: Neurology

## 2023-02-16 DIAGNOSIS — Z85820 Personal history of malignant melanoma of skin: Secondary | ICD-10-CM | POA: Diagnosis not present

## 2023-02-16 DIAGNOSIS — D485 Neoplasm of uncertain behavior of skin: Secondary | ICD-10-CM | POA: Diagnosis not present

## 2023-02-16 DIAGNOSIS — Z85828 Personal history of other malignant neoplasm of skin: Secondary | ICD-10-CM | POA: Diagnosis not present

## 2023-02-16 DIAGNOSIS — D2271 Melanocytic nevi of right lower limb, including hip: Secondary | ICD-10-CM | POA: Diagnosis not present

## 2023-02-16 DIAGNOSIS — D225 Melanocytic nevi of trunk: Secondary | ICD-10-CM | POA: Diagnosis not present

## 2023-02-16 DIAGNOSIS — D2272 Melanocytic nevi of left lower limb, including hip: Secondary | ICD-10-CM | POA: Diagnosis not present

## 2023-02-16 DIAGNOSIS — D2262 Melanocytic nevi of left upper limb, including shoulder: Secondary | ICD-10-CM | POA: Diagnosis not present

## 2023-02-16 DIAGNOSIS — L905 Scar conditions and fibrosis of skin: Secondary | ICD-10-CM | POA: Diagnosis not present

## 2023-02-16 DIAGNOSIS — L814 Other melanin hyperpigmentation: Secondary | ICD-10-CM | POA: Diagnosis not present

## 2023-02-16 DIAGNOSIS — D2261 Melanocytic nevi of right upper limb, including shoulder: Secondary | ICD-10-CM | POA: Diagnosis not present

## 2023-02-16 DIAGNOSIS — D0439 Carcinoma in situ of skin of other parts of face: Secondary | ICD-10-CM | POA: Diagnosis not present

## 2023-02-16 DIAGNOSIS — L570 Actinic keratosis: Secondary | ICD-10-CM | POA: Diagnosis not present

## 2023-03-10 ENCOUNTER — Encounter: Payer: Self-pay | Admitting: Nurse Practitioner

## 2023-03-10 ENCOUNTER — Ambulatory Visit: Payer: Medicare HMO | Admitting: Nurse Practitioner

## 2023-03-10 VITALS — BP 120/60 | HR 63 | Ht 71.0 in | Wt 211.6 lb

## 2023-03-10 DIAGNOSIS — G4733 Obstructive sleep apnea (adult) (pediatric): Secondary | ICD-10-CM

## 2023-03-10 NOTE — Progress Notes (Signed)
@Patient  ID: Ricardo [name], male    DOB: 12-03-1940, 82 y.o.   MRN: [id number]  Chief Complaint  Patient presents with   Follow-up    OSA F/U visit    Referring provider: Garlan Fillers, MD  HPI: 82 year old male, former smoker followed for OSA on CPAP.  He is a patient of Dr. Kavin Leech and last seen in office 11/10/2021.  Past medical history significant for hypertension, allergic rhinitis, GERD cervical myelopathy, sensorineural neural hearing loss.  TEST/EVENTS:  03/19/2015 HST: AHI 19.7/h, SpO2 low 80%  11/10/2021: OV with Dr. Delton Coombes.  OSA on CPAP.  Doing quite well.  Good compliance and great clinical benefit.  Has significantly improved energy, better sleep quality.  Does not go nights without it.  Continue same plan.  Follow-up in 1 year.  03/10/2023: Today-follow-up Patient presents today for follow-up.  Doing very well on CPAP.  Feels well rested on this.  Energy levels are good during the day.  No issues with leaks.  Wears a nasal mask.  Denies any issues with drowsy driving or morning headaches.  No concerns or complaints today.  02/07/2023-03/08/2023: CPAP 9 cmH2O 30/30 days; 100% >4 hr; average use 8 hours 14 minutes Leaks 95th 3.5  AHI 1.6  No Known Allergies  Immunization History  Administered Date(s) Administered   Influenza, High Dose Seasonal PF 03/15/2016   Influenza,inj,Quad PF,6+ Mos 12/04/2014   Influenza-Unspecified 12/04/2017, 11/02/2019   PFIZER(Purple Top)SARS-COV-2 Vaccination 04/20/2019, 05/21/2019    Past Medical History:  Diagnosis Date   Allergic rhinitis 05/24/2007   Qualifier: Diagnosis of  By: Laural Benes RN, [name]     Chest pain    Cough 07/13/2016   Depression    Diabetes mellitus without complication (HCC)    Dyspnea    Dysrhythmia    Falls frequently    Hemorrhoids    History of pneumonia    Hx of colonic polyps 10/31/2002   Hyperplastic    Hypertension    Leg cramps    Numbness and tingling in both hands    Obstructive sleep apnea     CPAP   Peptic ulcer    Syncope    Tachycardia     Tobacco History: Social History   Tobacco Use  Smoking Status Former   Types: Pipe   Quit date: 04/05/1988   Years since quitting: 34.9  Smokeless Tobacco Never   Counseling given: Not Answered   Outpatient Medications Prior to Visit  Medication Sig Dispense Refill   acetaminophen (TYLENOL) 650 MG CR tablet Take by mouth every 8 (eight) hours. 1 1/2 every 8 hours     amLODipine (NORVASC) 5 MG tablet Take 5 mg by mouth daily.     Ascorbic Acid (VITAMIN C) 500 MG CAPS Take 500 mg by mouth daily.     aspirin 81 MG tablet Take 81 mg by mouth daily. Reported on 07/07/2015     cholecalciferol (VITAMIN D3) 25 MCG (1000 UNIT) tablet Take 2,000 Units by mouth daily.     escitalopram (LEXAPRO) 10 MG tablet Take 10 mg by mouth daily.     irbesartan-hydrochlorothiazide (AVALIDE) 300-12.5 MG tablet Take 0.5 tablets by mouth 2 (two) times daily.     metFORMIN (GLUCOPHAGE-XR) 500 MG 24 hr tablet Take 500 mg by mouth daily.     Multiple Vitamin (MULTIVITAMIN) tablet Take 1 tablet by mouth daily.     Naproxen Sodium 220 MG CAPS Take 220 mg by mouth in the morning and at bedtime.  No facility-administered medications prior to visit.     Review of Systems:   Constitutional: No weight loss or gain, night sweats, fevers, chills, fatigue, or lassitude. HEENT: No headaches, difficulty swallowing, tooth/dental problems, or sore throat. No sneezing, itching, ear ache, nasal congestion, or post nasal drip CV:  No chest pain, orthopnea, PND, swelling in lower extremities, anasarca, dizziness, palpitations, syncope Resp: No shortness of breath with exertion or at rest. No excess mucus or change in color of mucus. No productive or non-productive. No hemoptysis. No wheezing.  No chest wall deformity GI:  No heartburn, indigestion GU: No nocturia Neuro: No dizziness or lightheadedness.  Psych: No depression or anxiety. Mood stable.     Physical  Exam:  BP 120/60 (BP Location: Right Arm, Cuff Size: Normal)   Pulse 63   Ht 5\' 11"  (1.803 m)   Wt 211 lb 9.6 oz (96 kg)   SpO2 97%   BMI 29.51 kg/m   GEN: Pleasant, interactive, well-appearing; in no acute distress HEENT:  Normocephalic and atraumatic. PERRLA. Sclera white. Nasal turbinates pink, moist and patent bilaterally. No rhinorrhea present. Oropharynx pink and moist, without exudate or edema. No lesions, ulcerations, or postnasal drip. Mallampati III NECK:  Supple w/ fair ROM. No JVD present. Normal carotid impulses w/o bruits. Thyroid symmetrical with no goiter or nodules palpated. No lymphadenopathy.   CV: RRR, no m/r/g, no peripheral edema. Pulses intact, +2 bilaterally. No cyanosis, pallor or clubbing. PULMONARY:  Unlabored, regular breathing. Clear bilaterally A&P w/o wheezes/rales/rhonchi. No accessory muscle use.  GI: BS present and normoactive. Soft, non-tender to palpation. No organomegaly or masses detected.  MSK: No erythema, warmth or tenderness. Cap refil <2 sec all extrem. No deformities or joint swelling noted.  Neuro: A/Ox3. No focal deficits noted.   Skin: Warm, no lesions or rashe Psych: Normal affect and behavior. Judgement and thought content appropriate.     Lab Results:  CBC    Component Value Date/Time   WBC 10.8 (H) 05/16/2020 0444   RBC 3.78 (L) 05/16/2020 0444   HGB 11.6 (L) 05/16/2020 0444   HCT 33.8 (L) 05/16/2020 0444   PLT 174 05/16/2020 0444   MCV 89.4 05/16/2020 0444   MCH 30.7 05/16/2020 0444   MCHC 34.3 05/16/2020 0444   RDW 13.1 05/16/2020 0444    BMET    Component Value Date/Time   NA 139 05/16/2020 0444   K 4.1 05/16/2020 0444   CL 104 05/16/2020 0444   CO2 25 05/16/2020 0444   GLUCOSE 160 (H) 05/16/2020 0444   BUN 22 05/16/2020 0444   CREATININE 1.15 05/16/2020 0444   CALCIUM 8.5 (L) 05/16/2020 0444   GFRNONAA >60 05/16/2020 0444    BNP No results found for: "BNP"   Imaging:  No results found.  Administration  History     None           No data to display          No results found for: "NITRICOXIDE"      Assessment & Plan:   Obstructive sleep apnea Moderate OSA on CPAP.  Excellent compliance and control.  Receives benefit from use.  Encouraged to continue using nightly.  Healthy weight loss encouraged.  Aware of proper care/use of device.  Safe driving practices reviewed.  Patient Instructions  Continue to use CPAP every night, minimum of 4-6 hours a night.  Change equipment as directed. Wash your tubing with warm soap and water daily, hang to dry. Wash humidifier portion weekly.  Use bottled, distilled water and change daily Be aware of reduced alertness and do not drive or operate heavy machinery if experiencing this or drowsiness.  Exercise encouraged, as tolerated. Healthy weight management discussed.  Avoid or decrease alcohol consumption and medications that make you more sleepy, if possible. Notify if persistent daytime sleepiness occurs even with consistent use of PAP therapy.  Follow up in 1 year with Dr. Delton Coombes or Philis Nettle, or sooner, if needed    Advised if symptoms do not improve or worsen, to please contact office for sooner follow up or seek emergency care.   I spent 25 minutes of dedicated to the care of this patient on the date of this encounter to include pre-visit review of records, face-to-face time with the patient discussing conditions above, post visit ordering of testing, clinical documentation with the electronic health record, making appropriate referrals as documented, and communicating necessary findings to members of the patients care team.  Noemi Chapel, NP 03/10/2023  Pt aware and understands NP's role.

## 2023-03-10 NOTE — Patient Instructions (Addendum)
Continue to use CPAP every night, minimum of 4-6 hours a night.  Change equipment as directed. Wash your tubing with warm soap and water daily, hang to dry. Wash humidifier portion weekly. Use bottled, distilled water and change daily Be aware of reduced alertness and do not drive or operate heavy machinery if experiencing this or drowsiness.  Exercise encouraged, as tolerated. Healthy weight management discussed.  Avoid or decrease alcohol consumption and medications that make you more sleepy, if possible. Notify if persistent daytime sleepiness occurs even with consistent use of PAP therapy.  Follow up in 1 year with Dr. Delton Coombes or Philis Nettle, or sooner, if needed

## 2023-03-10 NOTE — Assessment & Plan Note (Signed)
Moderate OSA on CPAP.  Excellent compliance and control.  Receives benefit from use.  Encouraged to continue using nightly.  Healthy weight loss encouraged.  Aware of proper care/use of device.  Safe driving practices reviewed.  Patient Instructions  Continue to use CPAP every night, minimum of 4-6 hours a night.  Change equipment as directed. Wash your tubing with warm soap and water daily, hang to dry. Wash humidifier portion weekly. Use bottled, distilled water and change daily Be aware of reduced alertness and do not drive or operate heavy machinery if experiencing this or drowsiness.  Exercise encouraged, as tolerated. Healthy weight management discussed.  Avoid or decrease alcohol consumption and medications that make you more sleepy, if possible. Notify if persistent daytime sleepiness occurs even with consistent use of PAP therapy.  Follow up in 1 year with Dr. Delton Coombes or Philis Nettle, or sooner, if needed

## 2023-04-19 DIAGNOSIS — G4733 Obstructive sleep apnea (adult) (pediatric): Secondary | ICD-10-CM | POA: Diagnosis not present

## 2023-04-21 DIAGNOSIS — Z125 Encounter for screening for malignant neoplasm of prostate: Secondary | ICD-10-CM | POA: Diagnosis not present

## 2023-04-21 DIAGNOSIS — E1151 Type 2 diabetes mellitus with diabetic peripheral angiopathy without gangrene: Secondary | ICD-10-CM | POA: Diagnosis not present

## 2023-04-21 DIAGNOSIS — E785 Hyperlipidemia, unspecified: Secondary | ICD-10-CM | POA: Diagnosis not present

## 2023-04-21 DIAGNOSIS — I10 Essential (primary) hypertension: Secondary | ICD-10-CM | POA: Diagnosis not present

## 2023-04-21 LAB — LAB REPORT - SCANNED: EGFR: 81.2

## 2023-04-28 DIAGNOSIS — E785 Hyperlipidemia, unspecified: Secondary | ICD-10-CM | POA: Diagnosis not present

## 2023-04-28 DIAGNOSIS — I10 Essential (primary) hypertension: Secondary | ICD-10-CM | POA: Diagnosis not present

## 2023-04-28 DIAGNOSIS — Z0000 Encounter for general adult medical examination without abnormal findings: Secondary | ICD-10-CM | POA: Diagnosis not present

## 2023-04-28 DIAGNOSIS — I739 Peripheral vascular disease, unspecified: Secondary | ICD-10-CM | POA: Diagnosis not present

## 2023-04-28 DIAGNOSIS — Z23 Encounter for immunization: Secondary | ICD-10-CM | POA: Diagnosis not present

## 2023-04-28 DIAGNOSIS — E1151 Type 2 diabetes mellitus with diabetic peripheral angiopathy without gangrene: Secondary | ICD-10-CM | POA: Diagnosis not present

## 2023-04-28 DIAGNOSIS — M25512 Pain in left shoulder: Secondary | ICD-10-CM | POA: Diagnosis not present

## 2023-04-28 DIAGNOSIS — G4733 Obstructive sleep apnea (adult) (pediatric): Secondary | ICD-10-CM | POA: Diagnosis not present

## 2023-04-28 DIAGNOSIS — G8929 Other chronic pain: Secondary | ICD-10-CM | POA: Diagnosis not present

## 2023-05-05 ENCOUNTER — Other Ambulatory Visit: Payer: Self-pay | Admitting: Neurology

## 2023-05-05 DIAGNOSIS — M542 Cervicalgia: Secondary | ICD-10-CM

## 2023-05-05 DIAGNOSIS — M51369 Other intervertebral disc degeneration, lumbar region without mention of lumbar back pain or lower extremity pain: Secondary | ICD-10-CM

## 2023-05-05 DIAGNOSIS — R269 Unspecified abnormalities of gait and mobility: Secondary | ICD-10-CM

## 2023-05-26 ENCOUNTER — Encounter: Payer: Self-pay | Admitting: Gastroenterology

## 2023-07-07 ENCOUNTER — Ambulatory Visit: Payer: Medicare HMO | Admitting: Gastroenterology

## 2023-07-07 ENCOUNTER — Encounter: Payer: Self-pay | Admitting: Gastroenterology

## 2023-07-07 VITALS — BP 138/70 | HR 74 | Ht 70.5 in | Wt 207.0 lb

## 2023-07-07 DIAGNOSIS — Z800 Family history of malignant neoplasm of digestive organs: Secondary | ICD-10-CM | POA: Diagnosis not present

## 2023-07-07 DIAGNOSIS — Z1211 Encounter for screening for malignant neoplasm of colon: Secondary | ICD-10-CM

## 2023-07-07 DIAGNOSIS — Z860102 Personal history of hyperplastic colon polyps: Secondary | ICD-10-CM | POA: Diagnosis not present

## 2023-07-07 DIAGNOSIS — K921 Melena: Secondary | ICD-10-CM | POA: Diagnosis not present

## 2023-07-07 DIAGNOSIS — K629 Disease of anus and rectum, unspecified: Secondary | ICD-10-CM

## 2023-07-07 DIAGNOSIS — K6289 Other specified diseases of anus and rectum: Secondary | ICD-10-CM

## 2023-07-07 MED ORDER — NA SULFATE-K SULFATE-MG SULF 17.5-3.13-1.6 GM/177ML PO SOLN: 1.0000 | Freq: Once | ORAL | 0 refills | Status: AC

## 2023-07-07 NOTE — Progress Notes (Signed)
 Agree with assessment and plan as outlined.

## 2023-07-07 NOTE — Progress Notes (Signed)
 Chief Complaint: Screening colonoscopy Primary GI MD: Dr. Jarold Motto  HPI: 83 year old male history of diabetes, hemorrhoids, OSA on CPAP, and others as listed below presents for evaluation of screening colonoscopy  Referred here by PCP for consideration of screening colonoscopy.  Recently underwent anterior decompression and fusion of C4-5 and C5/6 due to DDD and has been doing well since.  No other significant medical issues  Last labs available include a CBC in 2022 done during admission for a lumbar interbody fusion which showed mild anemia with Hgb 11.6 (likely postsurgical)  Discussed the use of AI scribe software for clinical note transcription with the patient, who gave verbal consent to proceed.  History of Present Illness  He presents with gastrointestinal symptoms, including dark stools and a palpable anal mass. He has experienced dark stools intermittently over the past two to three weeks, describing them as 'pretty dark' and sometimes 'sticky' or 'loose.' No heartburn, upper abdominal pain with eating, or use of medications like iron or Pepto-Bismol that could cause dark stools. He also mentions a large anal mass, suspected to be a hemorrhoid or growth, which is not painful but concerning.  Family history is significant for colon cancer in his brother, who passed away from the disease a few years older than his current age.  Social history includes a past history of smoking, though he states he never smoked cigarettes regularly. He is currently taking a baby aspirin daily and denies the use of blood thinners.  Patient reports labs completed 02/2023 with PCP. These were not included in referral notes. He declines labs today and prefers we request previous labs   PREVIOUS GI WORKUP   Colonoscopy 03/2012 for rectal bleeding - Moderate diverticulosis in descending and sigmoid colon - 2 flat polyps (hyperplastic) ranging 3 to 5 mm in rectum.  Polypectomy was performed using  snare cautery.  These appear to be very vascular polyps and are likely the site of rectal bleeding.  Removed by electrocautery techniques and cauterized  Past Medical History:  Diagnosis Date   Allergic rhinitis 05/24/2007   Qualifier: Diagnosis of  By: Laural Benes RN, [name]     Arthritis    Chest pain    Cough 07/13/2016   Depression    Diabetes mellitus without complication (HCC)    Dyspnea    Dysrhythmia    Falls frequently    Hemorrhoids    History of pneumonia    Hx of colonic polyps 10/31/2002   Hyperplastic    Hypertension    Kidney stones    Leg cramps    Melanoma (HCC)    on his forehead   Numbness and tingling in both hands    Obstructive sleep apnea    CPAP   Peptic ulcer    Syncope    Tachycardia     Past Surgical History:  Procedure Laterality Date   APPENDECTOMY  04/06/1955   CERVICAL FUSION     C4-5, C5-6   COLONOSCOPY     ESOPHAGOGASTRODUODENOSCOPY     MOHS SURGERY     on his forehead   ROTATOR CUFF REPAIR Left     Current Outpatient Medications  Medication Sig Dispense Refill   acetaminophen (TYLENOL) 650 MG CR tablet Take by mouth every 8 (eight) hours. 1 1/2 every 8 hours     amLODipine (NORVASC) 5 MG tablet Take 5 mg by mouth daily.     Ascorbic Acid (VITAMIN C) 500 MG CAPS Take 500 mg by mouth daily.  aspirin 81 MG tablet Take 81 mg by mouth daily. Reported on 07/07/2015     cholecalciferol (VITAMIN D3) 25 MCG (1000 UNIT) tablet Take 2,000 Units by mouth daily.     escitalopram (LEXAPRO) 10 MG tablet Take 10 mg by mouth daily.     irbesartan-hydrochlorothiazide (AVALIDE) 300-12.5 MG tablet Take 0.5 tablets by mouth 2 (two) times daily.     magnesium gluconate (MAGONATE) 500 (27 Mg) MG TABS tablet Take 500 mg by mouth daily.     metFORMIN (GLUCOPHAGE-XR) 500 MG 24 hr tablet Take 500 mg by mouth daily.     Multiple Vitamin (MULTIVITAMIN) tablet Take 1 tablet by mouth daily.     Naproxen Sodium 220 MG CAPS Take 220 mg by mouth in the morning and  at bedtime.     OVER THE COUNTER MEDICATION Potassium 650mg - contains 99mg  of Potassium. One daily     No current facility-administered medications for this visit.    Allergies as of 07/07/2023   (No Known Allergies)    Family History  Problem Relation Age of Onset   Hypertension Mother    Pancreatic cancer Mother    Stroke Father    Anuerysm Father        AAA   Bladder Cancer Sister    Anuerysm Brother        AAA   Colon cancer Brother        dx at age 25   Esophageal cancer Neg Hx     Social History   Socioeconomic History   Marital status: Married    Spouse name: Not on file   Number of children: 3   Years of education: Not on file   Highest education level: Not on file  Occupational History   Occupation: engineer-retired    Employer: [employer]  Tobacco Use   Smoking status: Former    Types: Pipe    Quit date: 04/05/1988    Years since quitting: 35.2   Smokeless tobacco: Never  Vaping Use   Vaping status: Never Used  Substance and Sexual Activity   Alcohol use: Yes    Comment: occasionally   Drug use: No   Sexual activity: Not on file  Other Topics Concern   Not on file  Social History Narrative   Right handed   Caffeine-1 cup daily   Lives with wife   Social Drivers of Corporate investment banker Strain: Not on file  Food Insecurity: Not on file  Transportation Needs: Not on file  Physical Activity: Not on file  Stress: Not on file  Social Connections: Unknown (08/18/2021)   [facility]   Social Network    Social Network: Not on file  Intimate Partner Violence: Unknown (07/10/2021)   Received from [employer], [facility]   HITS    Physically Hurt: Not on file    Insult or Talk Down To: Not on file    Threaten Physical Harm: Not on file    Scream or Curse: Not on file    Review of Systems:    Constitutional: No weight loss, fever, chills, weakness or fatigue HEENT: Eyes: No change in vision                Ears, Nose, Throat:  No change in hearing or congestion Skin: No rash or itching Cardiovascular: No chest pain, chest pressure or palpitations   Respiratory: No SOB or cough Gastrointestinal: See HPI and otherwise negative Genitourinary: No dysuria or  change in urinary frequency Neurological: No headache, dizziness or syncope Musculoskeletal: No new muscle or joint pain Hematologic: No bleeding or bruising Psychiatric: No history of depression or anxiety    Physical Exam:  Vital signs: BP 138/70   Pulse 74   Ht 5' 10.5" (1.791 m)   Wt 207 lb (93.9 kg)   BMI 29.28 kg/m   Constitutional: NAD, Well developed, Well nourished, alert and cooperative. Appears significantly younger than stated age Head:  Normocephalic and atraumatic. Eyes:   PEERL, EOMI. No icterus. Conjunctiva pink. Respiratory: Respirations even and unlabored. Lungs clear to auscultation bilaterally.   No wheezes, crackles, or rhonchi.  Cardiovascular:  Regular rate and rhythm. No peripheral edema, cyanosis or pallor.  Gastrointestinal:  Soft, nondistended, nontender. No rebound or guarding. Normal bowel sounds. No appreciable masses or hepatomegaly. Rectal:  Not performed. Politely declined and requested to defer to colonoscopy Msk:  Symmetrical without gross deformities. Without edema, no deformity or joint abnormality.  Neurologic:  Alert and  oriented x4;  grossly normal neurologically.  Skin:   Dry and intact without significant lesions or rashes. Psychiatric: Oriented to person, place and time. Demonstrates good judgement and reason without abnormal affect or behaviors.  RELEVANT LABS AND IMAGING: CBC    Component Value Date/Time   WBC 10.8 (H) 05/16/2020 0444   RBC 3.78 (L) 05/16/2020 0444   HGB 11.6 (L) 05/16/2020 0444   HCT 33.8 (L) 05/16/2020 0444   PLT 174 05/16/2020 0444   MCV 89.4 05/16/2020 0444   MCH 30.7 05/16/2020 0444   MCHC 34.3 05/16/2020 0444   RDW 13.1 05/16/2020 0444    CMP      Component Value Date/Time   NA 139 05/16/2020 0444   K 4.1 05/16/2020 0444   CL 104 05/16/2020 0444   CO2 25 05/16/2020 0444   GLUCOSE 160 (H) 05/16/2020 0444   BUN 22 05/16/2020 0444   CREATININE 1.15 05/16/2020 0444   CALCIUM 8.5 (L) 05/16/2020 0444   GFRNONAA >60 05/16/2020 0444     Assessment/Plan:   Rectal mass Screening for colon cancer Family history of colon cancer Reports rectal mass that is nonpainful and nonbleeding.  Declines examination today and prefers to defer to colonoscopy.  Multiple differentials include hemorrhoid versus skin tag versus mass versus other but would need to visualize before confirmation.  Family history of colon cancer in brother around 68.  Last colonoscopy in 2013 with hyperplastic polyps.  Patient appears significantly younger than stated age and is in adequate health to undergo colonoscopy. - Colonoscopy for further evaluation - I thoroughly discussed the procedure with the patient (at bedside) to include nature of the procedure, alternatives, benefits, and risks (including but not limited to bleeding, infection, perforation, anesthesia/cardiac pulmonary complications).  Patient verbalized understanding and gave verbal consent to proceed with procedure. - Thorough rectal exam at the time of colonoscopy as patient declined today  Melena Intermittent melena and denies Pepto/iron use.  Last CBC that is visible in 2022 showed mild anemia but this was in the setting of recent blood loss secondary to surgery.  We will request his labs from primary care as he declines labs today.  If CBC is not included we will have him come in and do CBC and CMP for further evaluation.  Reassuringly, no upper GI symptoms.  Aspirin use daily.  Patient request EGD. - EGD for further evaluation - I thoroughly discussed the procedure with the patient (at bedside) to include nature of the procedure, alternatives, benefits,  and risks (including but not limited to bleeding,  infection, perforation, anesthesia/cardiac pulmonary complications).  Patient verbalized understanding and gave verbal consent to proceed with procedure. - Follow-up per EGD findings  Patient request Dr. Adela Lank as his wife is also seen by Dr. Olen Cordial, PA-C [facility] 07/07/2023, 9:22 AM  Cc: Garlan Fillers, MD

## 2023-07-07 NOTE — Patient Instructions (Signed)
 You have been scheduled for an endoscopy and colonoscopy. Please follow the written instructions given to you at your visit today.  If you use inhalers (even only as needed), please bring them with you on the day of your procedure.  DO NOT TAKE 7 DAYS PRIOR TO TEST- Trulicity (dulaglutide) Ozempic, Wegovy (semaglutide) Mounjaro (tirzepatide) Bydureon Bcise (exanatide extended release)  DO NOT TAKE 1 DAY PRIOR TO YOUR TEST Rybelsus (semaglutide) Adlyxin (lixisenatide) Victoza (liraglutide) Byetta (exanatide) _______________________________________________________________________  Follow up as needed.  Thank you for trusting me with your gastrointestinal care!   Boone Master, PA   _______________________________________________________  If your blood pressure at your visit was 140/90 or greater, please contact your primary care physician to follow up on this.  _______________________________________________________  If you are age 83 or older, your body mass index should be between 23-30. Your Body mass index is 29.28 kg/m. If this is out of the aforementioned range listed, please consider follow up with your Primary Care Provider.  If you are age 86 or younger, your body mass index should be between 19-25. Your Body mass index is 29.28 kg/m. If this is out of the aformentioned range listed, please consider follow up with your Primary Care Provider.   ________________________________________________________  The [facility] providers would like to encourage you to use [facility] to communicate with providers for non-urgent requests or questions.  Due to long hold times on the telephone, sending your provider a message by [facility] may be a faster and more efficient way to get a response.  Please allow 48 business hours for a response.  Please remember that this is for non-urgent requests.  _______________________________________________________

## 2023-07-19 DIAGNOSIS — G4733 Obstructive sleep apnea (adult) (pediatric): Secondary | ICD-10-CM | POA: Diagnosis not present

## 2023-07-25 DIAGNOSIS — T1512XA Foreign body in conjunctival sac, left eye, initial encounter: Secondary | ICD-10-CM | POA: Diagnosis not present

## 2023-08-01 DIAGNOSIS — M79661 Pain in right lower leg: Secondary | ICD-10-CM | POA: Diagnosis not present

## 2023-08-01 DIAGNOSIS — M79604 Pain in right leg: Secondary | ICD-10-CM | POA: Diagnosis not present

## 2023-08-01 DIAGNOSIS — I87393 Chronic venous hypertension (idiopathic) with other complications of bilateral lower extremity: Secondary | ICD-10-CM | POA: Diagnosis not present

## 2023-08-01 DIAGNOSIS — M79662 Pain in left lower leg: Secondary | ICD-10-CM | POA: Diagnosis not present

## 2023-08-01 DIAGNOSIS — M79605 Pain in left leg: Secondary | ICD-10-CM | POA: Diagnosis not present

## 2023-08-03 ENCOUNTER — Encounter: Payer: Self-pay | Admitting: Gastroenterology

## 2023-08-10 ENCOUNTER — Ambulatory Visit: Admitting: Gastroenterology

## 2023-08-10 ENCOUNTER — Encounter: Payer: Self-pay | Admitting: Gastroenterology

## 2023-08-10 VITALS — BP 128/75 | HR 62 | Temp 98.2°F | Resp 17 | Ht 70.5 in | Wt 207.0 lb

## 2023-08-10 DIAGNOSIS — K648 Other hemorrhoids: Secondary | ICD-10-CM

## 2023-08-10 DIAGNOSIS — R195 Other fecal abnormalities: Secondary | ICD-10-CM

## 2023-08-10 DIAGNOSIS — K629 Disease of anus and rectum, unspecified: Secondary | ICD-10-CM

## 2023-08-10 DIAGNOSIS — K5730 Diverticulosis of large intestine without perforation or abscess without bleeding: Secondary | ICD-10-CM | POA: Diagnosis not present

## 2023-08-10 DIAGNOSIS — B9681 Helicobacter pylori [H. pylori] as the cause of diseases classified elsewhere: Secondary | ICD-10-CM | POA: Diagnosis not present

## 2023-08-10 DIAGNOSIS — K319 Disease of stomach and duodenum, unspecified: Secondary | ICD-10-CM

## 2023-08-10 DIAGNOSIS — Z800 Family history of malignant neoplasm of digestive organs: Secondary | ICD-10-CM

## 2023-08-10 DIAGNOSIS — G4733 Obstructive sleep apnea (adult) (pediatric): Secondary | ICD-10-CM | POA: Diagnosis not present

## 2023-08-10 DIAGNOSIS — Z1211 Encounter for screening for malignant neoplasm of colon: Secondary | ICD-10-CM

## 2023-08-10 DIAGNOSIS — E119 Type 2 diabetes mellitus without complications: Secondary | ICD-10-CM | POA: Diagnosis not present

## 2023-08-10 DIAGNOSIS — I10 Essential (primary) hypertension: Secondary | ICD-10-CM | POA: Diagnosis not present

## 2023-08-10 DIAGNOSIS — K2951 Unspecified chronic gastritis with bleeding: Secondary | ICD-10-CM | POA: Diagnosis not present

## 2023-08-10 DIAGNOSIS — K635 Polyp of colon: Secondary | ICD-10-CM | POA: Diagnosis not present

## 2023-08-10 DIAGNOSIS — D123 Benign neoplasm of transverse colon: Secondary | ICD-10-CM

## 2023-08-10 DIAGNOSIS — K6289 Other specified diseases of anus and rectum: Secondary | ICD-10-CM | POA: Diagnosis not present

## 2023-08-10 DIAGNOSIS — D120 Benign neoplasm of cecum: Secondary | ICD-10-CM

## 2023-08-10 DIAGNOSIS — K2970 Gastritis, unspecified, without bleeding: Secondary | ICD-10-CM

## 2023-08-10 DIAGNOSIS — F32A Depression, unspecified: Secondary | ICD-10-CM | POA: Diagnosis not present

## 2023-08-10 MED ORDER — SODIUM CHLORIDE 0.9 % IV SOLN: 500.0000 mL | INTRAVENOUS | Status: DC

## 2023-08-10 NOTE — Progress Notes (Signed)
 [facility] History and Physical   Primary Care Physician:  Bertha Broad, MD   Reason for Procedure:   Dark stools, rectal abnormality, family history of colon cancer  Plan:    EGD and colonoscopy     HPI: Ricardo [name] is a 83 y.o. male  here for EGD and colonoscopy - see office visit 07/07/23 for details. He is scheduled for EGD to evaluate history of dark stools. Occasional dysphagia.  Also complains of anorectal mass / soft tissue lesion. Last colonoscopy many years ago, 2013. Brother had colon cancer.  Otherwise feels well without any cardiopulmonary symptoms. He wishes to proceed today.   I have discussed risks / benefits of anesthesia and endoscopic procedure with Ricardo [name] and they wish to proceed with the exams as outlined today.    Past Medical History:  Diagnosis Date   Allergic rhinitis 05/24/2007   Qualifier: Diagnosis of  By: Lincoln Renshaw RN, [name]     Arthritis    Chest pain    Cough 07/13/2016   Depression    Diabetes mellitus without complication (HCC)    Dyspnea    Dysrhythmia    Falls frequently    Hemorrhoids    History of pneumonia    Hx of colonic polyps 10/31/2002   Hyperplastic    Hypertension    Kidney stones    Leg cramps    Melanoma (HCC)    on his forehead   Numbness and tingling in both hands    Obstructive sleep apnea    CPAP   Peptic ulcer    Syncope    Tachycardia     Past Surgical History:  Procedure Laterality Date   APPENDECTOMY  04/06/1955   CERVICAL FUSION     C4-5, C5-6   COLONOSCOPY     ESOPHAGOGASTRODUODENOSCOPY     MOHS SURGERY     on his forehead   ROTATOR CUFF REPAIR Left     Prior to Admission medications   Medication Sig Start Date End Date Taking? Authorizing Provider  acetaminophen  (TYLENOL ) 650 MG CR tablet Take by mouth every 8 (eight) hours. 1 1/2 every 8 hours   Yes [provider]  amLODipine (NORVASC) 5 MG tablet Take 5 mg by mouth daily. 06/08/21  Yes [provider]   Ascorbic Acid (VITAMIN C) 500 MG CAPS Take 500 mg by mouth daily.   Yes [provider]  aspirin 81 MG tablet Take 81 mg by mouth daily. Reported on 07/07/2015   Yes [provider]  cholecalciferol (VITAMIN D3) 25 MCG (1000 UNIT) tablet Take 2,000 Units by mouth daily.   Yes [provider]  escitalopram  (LEXAPRO ) 10 MG tablet Take 10 mg by mouth daily.   Yes [provider]  irbesartan -hydrochlorothiazide  (AVALIDE) 300-12.5 MG tablet Take 0.5 tablets by mouth 2 (two) times daily.   Yes [provider]  magnesium gluconate (MAGONATE) 500 (27 Mg) MG TABS tablet Take 500 mg by mouth daily.   Yes [provider]  metFORMIN  (GLUCOPHAGE -XR) 500 MG 24 hr tablet Take 500 mg by mouth daily. 02/03/20  Yes [provider]  Multiple Vitamin (MULTIVITAMIN) tablet Take 1 tablet by mouth daily.   Yes [provider]  Naproxen Sodium 220 MG CAPS Take 220 mg by mouth in the morning and at bedtime.   Yes [provider]  OVER THE COUNTER MEDICATION Potassium 650mg - contains 99mg  of Potassium. One daily   Yes [provider]    Current Outpatient  Medications  Medication Sig Dispense Refill   acetaminophen  (TYLENOL ) 650 MG CR tablet Take by mouth every 8 (eight) hours. 1 1/2 every 8 hours     amLODipine (NORVASC) 5 MG tablet Take 5 mg by mouth daily.     Ascorbic Acid (VITAMIN C) 500 MG CAPS Take 500 mg by mouth daily.     aspirin 81 MG tablet Take 81 mg by mouth daily. Reported on 07/07/2015     cholecalciferol (VITAMIN D3) 25 MCG (1000 UNIT) tablet Take 2,000 Units by mouth daily.     escitalopram  (LEXAPRO ) 10 MG tablet Take 10 mg by mouth daily.     irbesartan -hydrochlorothiazide  (AVALIDE) 300-12.5 MG tablet Take 0.5 tablets by mouth 2 (two) times daily.     magnesium gluconate (MAGONATE) 500 (27 Mg) MG TABS tablet Take 500 mg by mouth daily.     metFORMIN  (GLUCOPHAGE -XR) 500 MG 24 hr tablet Take 500 mg by mouth daily.      Multiple Vitamin (MULTIVITAMIN) tablet Take 1 tablet by mouth daily.     Naproxen Sodium 220 MG CAPS Take 220 mg by mouth in the morning and at bedtime.     OVER THE COUNTER MEDICATION Potassium 650mg - contains 99mg  of Potassium. One daily     Current Facility-Administered Medications  Medication Dose Route Frequency Provider Last Rate Last Admin   0.9 %  sodium chloride  infusion  500 mL Intravenous Continuous Kalik Hoare, Lendon Queen, MD        Allergies as of 08/10/2023   (No Known Allergies)    Family History  Problem Relation Age of Onset   Hypertension Mother    Pancreatic cancer Mother    Stroke Father    Anuerysm Father        AAA   Bladder Cancer Sister    Anuerysm Brother        AAA   Colon cancer Brother        dx at age 50   Esophageal cancer Neg Hx     Social History   Socioeconomic History   Marital status: Married    Spouse name: Not on file   Number of children: 3   Years of education: Not on file   Highest education level: Not on file  Occupational History   Occupation: engineer-retired    Employer: [employer]  Tobacco Use   Smoking status: Former    Types: Pipe    Quit date: 04/05/1988    Years since quitting: 35.3   Smokeless tobacco: Never  Vaping Use   Vaping status: Never Used  Substance and Sexual Activity   Alcohol  use: Yes    Comment: occasionally   Drug use: No   Sexual activity: Not on file  Other Topics Concern   Not on file  Social History Narrative   Right handed   Caffeine-1 cup daily   Lives with wife   Social Drivers of Corporate investment banker Strain: Not on file  Food Insecurity: Not on file  Transportation Needs: Not on file  Physical Activity: Not on file  Stress: Not on file  Social Connections: Unknown (08/18/2021)   Received from [facility]   Social Network    Social Network: Not on file  Intimate Partner Violence: Unknown (07/10/2021)   Received from [facility]    HITS    Physically Hurt: Not on file    Insult or Talk Down To: Not on file    Threaten Physical Harm: Not  on file    Scream or Curse: Not on file    Review of Systems: All other review of systems negative except as mentioned in the HPI.  Physical Exam: Vital signs BP 126/77   Pulse 68   Temp 98.2 F (36.8 C)   Ht 5' 10.5" (1.791 m)   Wt 207 lb (93.9 kg)   SpO2 94%   BMI 29.28 kg/m   General:   Alert,  Well-developed, pleasant and cooperative in NAD Lungs:  Clear throughout to auscultation.   Heart:  Regular rate and rhythm Abdomen:  Soft, nontender and nondistended.   Neuro/Psych:  Alert and cooperative. Normal mood and affect. A and O x 3  Christi Coward, MD [facility]

## 2023-08-10 NOTE — Progress Notes (Signed)
 Called to room to assist during endoscopic procedure.  Patient ID and intended procedure confirmed with present staff. Received instructions for my participation in the procedure from the performing physician.

## 2023-08-10 NOTE — Patient Instructions (Signed)
 Please read handouts provided. Continue present medications. Await pathology results. Daily fiber supplement if any constipation or straining. Resume previous diet.   YOU HAD AN ENDOSCOPIC PROCEDURE TODAY AT THE [facility]:   Refer to the procedure report that was given to you for any specific questions about what was found during the examination.  If the procedure report does not answer your questions, please call your gastroenterologist to clarify.  If you requested that your care partner not be given the details of your procedure findings, then the procedure report has been included in a sealed envelope for you to review at your convenience later.  YOU SHOULD EXPECT: Some feelings of bloating in the abdomen. Passage of more gas than usual.  Walking can help get rid of the air that was put into your GI tract during the procedure and reduce the bloating. If you had a lower endoscopy (such as a colonoscopy or flexible sigmoidoscopy) you may notice spotting of blood in your stool or on the toilet paper. If you underwent a bowel prep for your procedure, you may not have a normal bowel movement for a few days.  Please Note:  You might notice some irritation and congestion in your nose or some drainage.  This is from the oxygen used during your procedure.  There is no need for concern and it should clear up in a day or so.  SYMPTOMS TO REPORT IMMEDIATELY:  Following lower endoscopy (colonoscopy or flexible sigmoidoscopy):  Excessive amounts of blood in the stool  Significant tenderness or worsening of abdominal pains  Swelling of the abdomen that is new, acute  Fever of 100F or higher  Following upper endoscopy (EGD)  Vomiting of blood or coffee ground material  New chest pain or pain under the shoulder blades  Painful or persistently difficult swallowing  New shortness of breath  Fever of 100F or higher  Black, tarry-looking stools  For urgent or emergent issues, a  gastroenterologist can be reached at any hour by calling [phone]. Do not use MyChart messaging for urgent concerns.    DIET:  We do recommend a small meal at first, but then you may proceed to your regular diet.  Drink plenty of fluids but you should avoid alcoholic beverages for 24 hours.  ACTIVITY:  You should plan to take it easy for the rest of today and you should NOT DRIVE or use heavy machinery until tomorrow (because of the sedation medicines used during the test).    FOLLOW UP: Our staff will call the number listed on your records the next business day following your procedure.  We will call around 7:15- 8:00 am to check on you and address any questions or concerns that you may have regarding the information given to you following your procedure. If we do not reach you, we will leave a message.     If any biopsies were taken you will be contacted by phone or by letter within the next 1-3 weeks.  Please call us  at [phone] if you have not heard about the biopsies in 3 weeks.    SIGNATURES/CONFIDENTIALITY: You and/or your care partner have signed paperwork which will be entered into your electronic medical record.  These signatures attest to the fact that that the information above on your After Visit Summary has been reviewed and is understood.  Full responsibility of the confidentiality of this discharge information lies with you and/or your care-partner.

## 2023-08-10 NOTE — Progress Notes (Signed)
 Pt's states no medical or surgical changes since previsit or office visit.

## 2023-08-10 NOTE — Op Note (Signed)
 [facility] Patient Name: Ricardo [name] Procedure Date: 08/10/2023 2:49 PM MRN: [id number] Endoscopist: Landon Pinion P. [name] , MD, [id number] Age: 83 Referring MD:  Date of Birth: April 09, 1940 Gender: Male Account #: [id number] Procedure:                Upper GI endoscopy Indications:              history of dark stools concerning for melena, also                            has had mild dysphagia post c-spine surgery Medicines:                Monitored Anesthesia Care Procedure:                Pre-Anesthesia Assessment:                           - Prior to the procedure, a History and Physical                            was performed, and patient medications and                            allergies were reviewed. The patient's tolerance of                            previous anesthesia was also reviewed. The risks                            and benefits of the procedure and the sedation                            options and risks were discussed with the patient.                            All questions were answered, and informed consent                            was obtained. Prior Anticoagulants: The patient has                            taken no anticoagulant or antiplatelet agents. ASA                            Grade Assessment: III - A patient with severe                            systemic disease. After reviewing the risks and                            benefits, the patient was deemed in satisfactory                            condition to undergo the procedure.  After obtaining informed consent, the endoscope was                            passed under direct vision. Throughout the                            procedure, the patient's blood pressure, pulse, and                            oxygen saturations were monitored continuously. The                            GIF [id number] #[id number] was introduced through the                            mouth,  and advanced to the second part of duodenum.                            The upper GI endoscopy was accomplished without                            difficulty. The patient tolerated the procedure                            well. Scope In: Scope Out: Findings:                 Esophagogastric landmarks were identified: the                            Z-line was found at 42 cm, the gastroesophageal                            junction was found at 42 cm and the upper extent of                            the gastric folds was found at 42 cm from the                            incisors.                           The exam of the esophagus was otherwise normal. No                            obvious stenosis / stricture appreciated.                           Diffuse mildly erythematous mucosa was found in the                            gastric fundus and in the gastric body.                           The exam  of the stomach was otherwise normal.                           Biopsies were taken with a cold forceps for                            Helicobacter pylori testing.                           The examined duodenum was normal. Complications:            No immediate complications. Estimated blood loss:                            Minimal. Estimated Blood Loss:     Estimated blood loss was minimal. Impression:               - Esophagogastric landmarks identified.                           - Normal esophagus otherwise.                           - Erythematous mucosa in the gastric fundus and                            gastric body.                           - Normal stomach otherwise - biopsies taken to rule                            out H pylori                           - Normal examined duodenum.                           Nothing concerning in regards to history of dark                            stools (which has reportedly resolved). Recommendation:           - Patient has a contact number  available for                            emergencies. The signs and symptoms of potential                            delayed complications were discussed with the                            patient. Return to normal activities tomorrow.                            Written discharge instructions were provided to the  patient.                           - Resume previous diet.                           - Continue present medications.                           - Await pathology results. Landon Pinion P. Larita Deremer, MD 08/10/2023 3:07:24 PM This report has been signed electronically.

## 2023-08-10 NOTE — Op Note (Signed)
 [facility] Patient Name: Ricardo [name] Procedure Date: 08/10/2023 2:48 PM MRN: [id number] Endoscopist: Landon Pinion P. [name] , MD, [id number] Age: 83 Referring MD:  Date of Birth: 1941/01/06 Gender: Male Account #: [id number] Procedure:                Colonoscopy Indications:              Screening in patient at increased risk: Family                            history of 1st-degree relative with colorectal                            cancer (brother with colon cancer), subjectively                            patient endorses rectal protuberance / lesion Medicines:                Monitored Anesthesia Care Procedure:                Pre-Anesthesia Assessment:                           - Prior to the procedure, a History and Physical                            was performed, and patient medications and                            allergies were reviewed. The patient's tolerance of                            previous anesthesia was also reviewed. The risks                            and benefits of the procedure and the sedation                            options and risks were discussed with the patient.                            All questions were answered, and informed consent                            was obtained. Prior Anticoagulants: The patient has                            taken no anticoagulant or antiplatelet agents. ASA                            Grade Assessment: III - A patient with severe                            systemic disease. After reviewing the risks and  benefits, the patient was deemed in satisfactory                            condition to undergo the procedure.                           After obtaining informed consent, the colonoscope                            was passed under direct vision. Throughout the                            procedure, the patient's blood pressure, pulse, and                            oxygen  saturations were monitored continuously. The                            CF [id number] #[id number] was introduced through the anus                            and advanced to the the cecum, identified by                            appendiceal orifice and ileocecal valve. The                            colonoscopy was performed without difficulty. The                            patient tolerated the procedure well. The quality                            of the bowel preparation was good. The ileocecal                            valve, appendiceal orifice, and rectum were                            photographed. Scope In: 3:10:00 PM Scope Out: 3:30:49 PM Scope Withdrawal Time: 0 hours 15 minutes 48 seconds  Total Procedure Duration: 0 hours 20 minutes 49 seconds  Findings:                 The perianal and digital rectal examinations were                            normal. There are diminutive perianal superficial                            cystic lesions noted.                           Six flat and sessile polyps were found in the  cecum. The polyps were 1 to 4 mm in size. These                            polyps were removed with a cold snare. Resection                            and retrieval were complete.                           A 4 mm polyp was found in the hepatic flexure. The                            polyp was sessile. The polyp was removed with a                            cold snare. Resection and retrieval were complete.                           A 3 mm polyp was found in the transverse colon. The                            polyp was sessile. The polyp was removed with a                            cold snare. Resection and retrieval were complete.                           A few diverticula were found in the sigmoid colon.                           Internal hemorrhoids were found during                            retroflexion. The hemorrhoids were moderate.                            Anal papilla(e) were hypertrophied.                           The exam was otherwise without abnormality. Complications:            No immediate complications. Estimated blood loss:                            Minimal. Estimated Blood Loss:     Estimated blood loss was minimal. Impression:               - Six 1 to 4 mm polyps in the cecum, removed with a                            cold snare. Resected and retrieved.                           - One 4 mm  polyp at the hepatic flexure, removed                            with a cold snare. Resected and retrieved.                           - One 3 mm polyp in the transverse colon, removed                            with a cold snare. Resected and retrieved.                           - Diverticulosis in the sigmoid colon.                           - Internal hemorrhoids.                           - Anal papilla(e) were hypertrophied.                           - The examination was otherwise normal.                           Suspect hemorrhoids are prolapsing causing the                            patient's symptoms. Recommendation:           - Patient has a contact number available for                            emergencies. The signs and symptoms of potential                            delayed complications were discussed with the                            patient. Return to normal activities tomorrow.                            Written discharge instructions were provided to the                            patient.                           - Resume previous diet.                           - Continue present medications.                           - Await pathology results.                           - Daily fiber supplement if any constipation or  straining Lendon Queen. Lachanda Buczek, MD 08/10/2023 3:42:26 PM This report has been signed electronically.

## 2023-08-10 NOTE — Progress Notes (Signed)
 A/o x 3, VSS, gd SR's, pleased with anesthesia, report to RN

## 2023-08-11 ENCOUNTER — Telehealth: Payer: Self-pay | Admitting: *Deleted

## 2023-08-11 NOTE — Telephone Encounter (Signed)
 Attempted f/u phone call. No answer. Left message.

## 2023-08-15 LAB — SURGICAL PATHOLOGY

## 2023-08-16 ENCOUNTER — Encounter: Payer: Self-pay | Admitting: Cardiovascular Disease

## 2023-08-16 ENCOUNTER — Ambulatory Visit: Attending: Cardiovascular Disease | Admitting: Cardiovascular Disease

## 2023-08-16 VITALS — BP 132/77 | HR 62 | Ht 70.5 in | Wt 208.6 lb

## 2023-08-16 DIAGNOSIS — I10 Essential (primary) hypertension: Secondary | ICD-10-CM

## 2023-08-16 NOTE — Patient Instructions (Signed)
 Lab Work: Lipids, ALT, BMET in 3 months If you have labs (blood work) drawn today and your tests are completely normal, you will receive your results only by: MyChart Message (if you have MyChart) OR A paper copy in the mail If you have any lab test that is abnormal or we need to change your treatment, we will call you to review the results.  Follow-Up: At [facility], you and your health needs are our priority.  As part of our continuing mission to provide you with exceptional heart care, our providers are all part of one team.  This team includes your primary Cardiologist (physician) and Advanced Practice Providers or APPs (Physician Assistants and Nurse Practitioners) who all work together to provide you with the care you need, when you need it.  Your next appointment:   1 year(s)  Provider:   Ahmad Alert, MD

## 2023-08-16 NOTE — Progress Notes (Signed)
 Cardiology Office Note:    Date:  08/16/2023   ID:  [name] GEESLIN, DOB [birth year], MRN [id number]  PCP:  Bertha Broad, MD  [facility] Cardiologist:  Pammie Chirino  [facility] Electrophysiologist:  None   Referring MD: Bertha Broad, MD   No chief complaint on file.   History of Present Illness:    Ricardo [name] is a 83 y.o. male with a hx of dyspnea..   I last saw him around 2010. We were asked to see him again by Dr. Efraim [name] for further evaluation of progressive dyspena  He has been having more shortness of breath and fatigue with exertion .  No CP or dyspnea Getting some regular exercise,  Issues - balance is off - some of this issue sounds like orthostatic hypotension .  Most of his balance issues occur after he has been standing .   Difficult for him to walk heel-to-toe  -  Has leg cramps at night .  Relieved with stretching his legs   July 20, 2021 [name] is seen for follow up  He back surgery a year ago, Had surgery but now has some issues with the other leg  BP is good tday  Dr. Efraim [name] added amlodipine    Aug 10, 2022 [name] is seen for follow up of his HTN May need to have neck surgery of C5 and C6 Has compression  Walks treadmill 3 times a week.   15 min  No cp or dyspnea .    He is at low risk for his upcoming neck surgery . Ok to hold ASA for 5-7 days prior to surgery    Aug 16, 2023 [name] is seen for follow up of his CP , HTN Neck surgery went well  ECG shows RBBB - No significant changes from last year  No CP  ,  Exercises regularly  Is outside working in the yard and garden regularly   Had some Lifeline screening test recently   He had abnormal ABI ( left ABI is 1.43)  Has mild bilateral plaque bilaterally   His father and older brothers had abdominal aneurisms   He will work on improving his diet and exercise  His goal LDL is < 70      Past Medical History:  Diagnosis Date   Allergic rhinitis 05/24/2007   Qualifier:  Diagnosis of  By: Lincoln Renshaw RN, [name]     Arthritis    Chest pain    Cough 07/13/2016   Depression    Diabetes mellitus without complication (HCC)    Dyspnea    Dysrhythmia    Falls frequently    Hemorrhoids    History of pneumonia    Hx of colonic polyps 10/31/2002   Hyperplastic    Hypertension    Kidney stones    Leg cramps    Melanoma (HCC)    on his forehead   Numbness and tingling in both hands    Obstructive sleep apnea    CPAP   Peptic ulcer    Syncope    Tachycardia     Past Surgical History:  Procedure Laterality Date   APPENDECTOMY  04/06/1955   CERVICAL FUSION     C4-5, C5-6   COLONOSCOPY     ESOPHAGOGASTRODUODENOSCOPY     MOHS SURGERY     on his forehead   ROTATOR CUFF REPAIR Left     Current Medications: Current Meds  Medication Sig   acetaminophen  (TYLENOL ) 650 MG CR tablet Take  by mouth every 8 (eight) hours. 1 1/2 every 8 hours   amLODipine (NORVASC) 5 MG tablet Take 5 mg by mouth daily.   Ascorbic Acid (VITAMIN C) 500 MG CAPS Take 500 mg by mouth daily.   aspirin 81 MG tablet Take 81 mg by mouth daily. Reported on 07/07/2015   cholecalciferol (VITAMIN D3) 25 MCG (1000 UNIT) tablet Take 2,000 Units by mouth daily.   escitalopram  (LEXAPRO ) 10 MG tablet Take 10 mg by mouth daily.   irbesartan -hydrochlorothiazide  (AVALIDE) 300-12.5 MG tablet Take 0.5 tablets by mouth 2 (two) times daily.   magnesium gluconate (MAGONATE) 500 (27 Mg) MG TABS tablet Take 500 mg by mouth daily.   metFORMIN  (GLUCOPHAGE -XR) 500 MG 24 hr tablet Take 500 mg by mouth daily.   Multiple Vitamin (MULTIVITAMIN) tablet Take 1 tablet by mouth daily.   Naproxen Sodium 220 MG CAPS Take 220 mg by mouth in the morning and at bedtime.   OVER THE COUNTER MEDICATION Potassium 650mg - contains 99mg  of Potassium. One daily     Allergies:   Patient has no known allergies.   Social History   Socioeconomic History   Marital status: Married    Spouse name: Not on file   Number of children:  3   Years of education: Not on file   Highest education level: Not on file  Occupational History   Occupation: engineer-retired    Employer: [employer]  Tobacco Use   Smoking status: Former    Types: Pipe    Quit date: 04/05/1988    Years since quitting: 35.3   Smokeless tobacco: Never  Vaping Use   Vaping status: Never Used  Substance and Sexual Activity   Alcohol  use: Yes    Comment: occasionally   Drug use: No   Sexual activity: Not on file  Other Topics Concern   Not on file  Social History Narrative   Right handed   Caffeine-1 cup daily   Lives with wife   Social Drivers of Corporate investment banker Strain: Not on file  Food Insecurity: Not on file  Transportation Needs: Not on file  Physical Activity: Not on file  Stress: Not on file  Social Connections: Unknown (08/18/2021)   Received from [facility]   Social Network    Social Network: Not on file     Family History: The patient's family history includes Anuerysm in his brother and father; Bladder Cancer in his sister; Colon cancer in his brother; Hypertension in his mother; Pancreatic cancer in his mother; Stroke in his father. There is no history of Esophageal cancer.  ROS:   Please see the history of present illness.     All other systems reviewed and are negative.  EKGs/Labs/Other Studies Reviewed:    The following studies were reviewed today:    Recent Labs: No results found for requested labs within last 365 days.  Recent Lipid Panel No results found for: "CHOL", "TRIG", "HDL", "CHOLHDL", "VLDL", "LDLCALC", "LDLDIRECT"  Physical Exam:     Physical Exam: Blood pressure 132/77, pulse 62, height 5' 10.5" (1.791 m), weight 208 lb 9.6 oz (94.6 kg), SpO2 96%.       GEN:  Well nourished, well developed in no acute distress HEENT: Normal NECK: No JVD; No carotid bruits LYMPHATICS: No lymphadenopathy CARDIAC: RRR , no murmurs, rubs, gallops RESPIRATORY:  Clear to  auscultation without rales, wheezing or rhonchi  ABDOMEN: Soft, non-tender, non-distended MUSCULOSKELETAL:  No edema; No deformity  SKIN: Warm and  dry NEUROLOGIC:  Alert and oriented x 3    ECG:    EKG Interpretation Date/Time:  Tuesday Aug 16 2023 17:03:53 EDT Ventricular Rate:  64 PR Interval:  204 QRS Duration:  140 QT Interval:  474 QTC Calculation: 489 R Axis:   41  Text Interpretation: Normal sinus rhythm Right bundle branch block Cannot rule out Inferior infarct , age undetermined No significant change since last tracing Confirmed by Ahmad Alert ([id number]) on 08/16/2023 5:25:07 PM   1. Primary hypertension      Assessment and Plan:    1.  HTN:    well controlled   2.   PAD :  Lifeline screening showed an ABI of 1.43 on his left side.  I told him that his LDL goal is now 70.  He will work on diet and exercise and we will recheck labs in 3 months.  I also told him that we can certainly have him seen by our partners who do peripheral vascular work.  Will have him follow-up in 1 year.        Medication Adjustments/Labs and Tests Ordered: Current medicines are reviewed at length with the patient today.  Concerns regarding medicines are outlined above.  Orders Placed This Encounter  Procedures   EKG 12-Lead   No orders of the defined types were placed in this encounter.   There are no Patient Instructions on file for this visit.   Signed, Ahmad Alert, MD  08/16/2023 5:22 PM    [facility]

## 2023-08-16 NOTE — Progress Notes (Signed)
 Ricardo [name]

## 2023-08-17 ENCOUNTER — Encounter: Payer: Self-pay | Admitting: Cardiovascular Disease

## 2023-08-17 DIAGNOSIS — L814 Other melanin hyperpigmentation: Secondary | ICD-10-CM | POA: Diagnosis not present

## 2023-08-17 DIAGNOSIS — D2262 Melanocytic nevi of left upper limb, including shoulder: Secondary | ICD-10-CM | POA: Diagnosis not present

## 2023-08-17 DIAGNOSIS — L570 Actinic keratosis: Secondary | ICD-10-CM | POA: Diagnosis not present

## 2023-08-17 DIAGNOSIS — L821 Other seborrheic keratosis: Secondary | ICD-10-CM | POA: Diagnosis not present

## 2023-08-17 DIAGNOSIS — D2271 Melanocytic nevi of right lower limb, including hip: Secondary | ICD-10-CM | POA: Diagnosis not present

## 2023-08-17 DIAGNOSIS — D225 Melanocytic nevi of trunk: Secondary | ICD-10-CM | POA: Diagnosis not present

## 2023-08-17 DIAGNOSIS — D2272 Melanocytic nevi of left lower limb, including hip: Secondary | ICD-10-CM | POA: Diagnosis not present

## 2023-08-17 DIAGNOSIS — L905 Scar conditions and fibrosis of skin: Secondary | ICD-10-CM | POA: Diagnosis not present

## 2023-08-17 DIAGNOSIS — D2261 Melanocytic nevi of right upper limb, including shoulder: Secondary | ICD-10-CM | POA: Diagnosis not present

## 2023-08-17 DIAGNOSIS — Z85828 Personal history of other malignant neoplasm of skin: Secondary | ICD-10-CM | POA: Diagnosis not present

## 2023-08-17 DIAGNOSIS — Z85820 Personal history of malignant melanoma of skin: Secondary | ICD-10-CM | POA: Diagnosis not present

## 2023-08-18 ENCOUNTER — Ambulatory Visit: Payer: Self-pay | Admitting: Gastroenterology

## 2023-08-18 DIAGNOSIS — A048 Other specified bacterial intestinal infections: Secondary | ICD-10-CM

## 2023-08-19 MED ORDER — TETRACYCLINE HCL 500 MG PO TABS: 1.0000 | ORAL_TABLET | Freq: Four times a day (QID) | ORAL | 0 refills | Status: AC

## 2023-08-19 MED ORDER — OMEPRAZOLE 20 MG PO CPDR: 20.0000 mg | DELAYED_RELEASE_CAPSULE | Freq: Two times a day (BID) | ORAL | 0 refills | Status: AC

## 2023-08-19 MED ORDER — BISMUTH SUBSALICYLATE 262 MG PO CHEW: 524.0000 mg | CHEWABLE_TABLET | Freq: Four times a day (QID) | ORAL | 0 refills | Status: AC

## 2023-08-19 MED ORDER — METRONIDAZOLE 500 MG PO TABS: 500.0000 mg | ORAL_TABLET | Freq: Three times a day (TID) | ORAL | 0 refills | Status: AC

## 2023-08-22 ENCOUNTER — Telehealth: Payer: Self-pay | Admitting: Gastroenterology

## 2023-08-22 NOTE — Telephone Encounter (Signed)
 Patient calling with questions about whether his wife should be tested to H Pylori since it can be passed through saliva etc. Advised that it is not unreasonable for her to be tested through her PCP. Patient indicates though that wife was already advised she is due for colonoscopy procedure etc and he would like her scheduled for endoscopy and colonoscopy for further evaluation. Due wife's age, previous chart notes, she has instead been scheduled with one of our providers to discuss the appropriate plan of care.

## 2023-08-22 NOTE — Telephone Encounter (Signed)
 Patient called and stated that he would to get a call back from [place]. Patient did not wish provide any more information. Please advise.

## 2023-09-02 ENCOUNTER — Other Ambulatory Visit: Payer: Self-pay | Admitting: Gastroenterology

## 2023-09-15 ENCOUNTER — Other Ambulatory Visit: Payer: Self-pay | Admitting: *Deleted

## 2023-09-15 ENCOUNTER — Telehealth: Payer: Self-pay | Admitting: Cardiovascular Disease

## 2023-09-15 DIAGNOSIS — M79604 Pain in right leg: Secondary | ICD-10-CM

## 2023-09-15 NOTE — Telephone Encounter (Signed)
 Spoke with pt, he reports he does have pain in his left leg. He reports he can walk about 1/2 mile and he will develop burning and pain in his leg. If he rest and get off his feet, the symptoms will dissolve. He is also wanting to change to dr berry since dr Alroy Aspen is leaving. Explained to patient according to the notes, dr berry may need for him to have more testing. Aware will forward to dr berry to review.

## 2023-09-15 NOTE — Telephone Encounter (Signed)
 Spoke with pt, dopplers and follow up appointment scheduled.

## 2023-09-15 NOTE — Telephone Encounter (Signed)
 Left message for pt to call.

## 2023-09-15 NOTE — Telephone Encounter (Signed)
 Pt called in stating at last appt Dr. Alroy Aspen discussed him seeing Dr. Katheryne Pane for PV. He is requesting to make an appt for it. Please advise if referral can be placed or if he needs one.

## 2023-09-16 ENCOUNTER — Other Ambulatory Visit (HOSPITAL_COMMUNITY): Payer: Self-pay | Admitting: Cardiovascular Disease

## 2023-09-16 ENCOUNTER — Ambulatory Visit (HOSPITAL_COMMUNITY)
Admission: RE | Admit: 2023-09-16 | Discharge: 2023-09-16 | Disposition: A | Discharge status: Home / Self Care | Source: Ambulatory Visit | Attending: Cardiovascular Disease | Admitting: Cardiovascular Disease

## 2023-09-16 ENCOUNTER — Ambulatory Visit: Payer: Self-pay | Admitting: Cardiovascular Disease

## 2023-09-16 DIAGNOSIS — M79605 Pain in left leg: Secondary | ICD-10-CM | POA: Diagnosis not present

## 2023-09-16 DIAGNOSIS — M79604 Pain in right leg: Secondary | ICD-10-CM | POA: Diagnosis not present

## 2023-09-16 DIAGNOSIS — I739 Peripheral vascular disease, unspecified: Secondary | ICD-10-CM

## 2023-09-18 LAB — VAS US ABI WITH/WO TBI
Left ABI: 1.22
Right ABI: 1.17

## 2023-10-10 ENCOUNTER — Encounter: Payer: Self-pay | Admitting: Cardiovascular Disease

## 2023-10-10 ENCOUNTER — Ambulatory Visit: Attending: Cardiovascular Disease | Admitting: Cardiovascular Disease

## 2023-10-10 VITALS — BP 130/74 | HR 70 | Ht 70.5 in | Wt 205.6 lb

## 2023-10-10 DIAGNOSIS — I10 Essential (primary) hypertension: Secondary | ICD-10-CM | POA: Diagnosis not present

## 2023-10-10 DIAGNOSIS — G4733 Obstructive sleep apnea (adult) (pediatric): Secondary | ICD-10-CM | POA: Diagnosis not present

## 2023-10-10 DIAGNOSIS — I4510 Unspecified right bundle-branch block: Secondary | ICD-10-CM | POA: Insufficient documentation

## 2023-10-10 NOTE — Assessment & Plan Note (Signed)
 History of essential hypertension with blood pressure measured today at 130/74.  He is on amlodipine, and Avalide.

## 2023-10-10 NOTE — Assessment & Plan Note (Signed)
 History of obstructive sleep apnea on CPAP.

## 2023-10-10 NOTE — Assessment & Plan Note (Signed)
 Chronic

## 2023-10-10 NOTE — Progress Notes (Unsigned)
 10/10/2023 Ricardo [name]   07/27/1940  [id number]  Primary Physician Yolande Toribio MATSU, MD Primary Cardiologist: Dorn JINNY Lesches MD [facility]I [place], [place]  HPI:  Ricardo [name] is a 83 y.o. thin-appearing married Caucasian male father of 3 sons, grandfather of 7 grandchildren he is a retired Public affairs consultant.  He was a patient of Dr. Allena.  He was referred to me for peripheral vascular evaluation and to assume his primary cardiology care.  His risk factors include treated hypertension and diabetes.  There is no family history of heart disease.  He has never had a heart attack or stroke.  He is pretty active and otherwise asymptomatic.  Does have obstructive sleep apnea on CPAP.  Has had back surgery in the past and has complained of left leg discomfort although most recent Dopplers performed 09/16/2023 revealed normal ABIs bilaterally with normal duplex of his left lower extremity.  Also complains of restless leg syndrome type symptoms at night.   Current Meds  Medication Sig   acetaminophen  (TYLENOL ) 650 MG CR tablet Take by mouth every 8 (eight) hours. 1 1/2 every 8 hours   amLODipine (NORVASC) 5 MG tablet Take 5 mg by mouth daily.   Ascorbic Acid (VITAMIN C) 500 MG CAPS Take 500 mg by mouth daily.   aspirin 81 MG tablet Take 81 mg by mouth daily. Reported on 07/07/2015   cholecalciferol (VITAMIN D3) 25 MCG (1000 UNIT) tablet Take 2,000 Units by mouth daily.   escitalopram  (LEXAPRO ) 10 MG tablet Take 10 mg by mouth daily.   irbesartan -hydrochlorothiazide  (AVALIDE) 300-12.5 MG tablet Take 0.5 tablets by mouth 2 (two) times daily.   magnesium gluconate (MAGONATE) 500 (27 Mg) MG TABS tablet Take 500 mg by mouth daily.   metFORMIN  (GLUCOPHAGE -XR) 500 MG 24 hr tablet Take 500 mg by mouth daily.   Multiple Vitamin (MULTIVITAMIN) tablet Take 1 tablet by mouth daily.   Naproxen Sodium 220 MG CAPS Take 220 mg by mouth in the morning and at bedtime.   OVER THE COUNTER MEDICATION  Potassium 650mg - contains 99mg  of Potassium. One daily   Potassium 99 MG TABS Take 1 tablet by mouth daily at 6 (six) AM.     No Known Allergies  Social History   Socioeconomic History   Marital status: Married    Spouse name: Not on file   Number of children: 3   Years of education: Not on file   Highest education level: Not on file  Occupational History   Occupation: engineer-retired    Employer: [employer]  Tobacco Use   Smoking status: Former    Types: Pipe    Quit date: 04/05/1988    Years since quitting: 35.5   Smokeless tobacco: Never  Vaping Use   Vaping status: Never Used  Substance and Sexual Activity   Alcohol  use: Yes    Comment: occasionally   Drug use: No   Sexual activity: Not on file  Other Topics Concern   Not on file  Social History Narrative   Right handed   Caffeine-1 cup daily   Lives with wife   Social Drivers of Health   Financial Resource Strain: Not on file  Food Insecurity: Not on file  Transportation Needs: Not on file  Physical Activity: Not on file  Stress: Not on file  Social Connections: Unknown (08/18/2021)   Received from [facility]   Social Network    Social Network: Not on file  Intimate Partner Violence: Unknown (07/10/2021)  Received from [facility]   HITS    Physically Hurt: Not on file    Insult or Talk Down To: Not on file    Threaten Physical Harm: Not on file    Scream or Curse: Not on file     Review of Systems: General: negative for chills, fever, night sweats or weight changes.  Cardiovascular: negative for chest pain, dyspnea on exertion, edema, orthopnea, palpitations, paroxysmal nocturnal dyspnea or shortness of breath Dermatological: negative for rash Respiratory: negative for cough or wheezing Urologic: negative for hematuria Abdominal: negative for nausea, vomiting, diarrhea, bright red blood per rectum, melena, or hematemesis Neurologic: negative for visual changes, syncope, or dizziness All  other systems reviewed and are otherwise negative except as noted above.    Blood pressure 130/74, pulse 70, height 5' 10.5 (1.791 m), weight 205 lb 9.6 oz (93.3 kg), SpO2 98%.  General appearance: alert and no distress Neck: no adenopathy, no carotid bruit, no JVD, supple, symmetrical, trachea midline, and thyroid not enlarged, symmetric, no tenderness/mass/nodules Lungs: clear to auscultation bilaterally Heart: regular rate and rhythm, S1, S2 normal, no murmur, click, rub or gallop Extremities: extremities normal, atraumatic, no cyanosis or edema Pulses: 2+ and symmetric Skin: Skin color, texture, turgor normal. No rashes or lesions Neurologic: Grossly normal  EKG not performed today      ASSESSMENT AND PLAN:   Obstructive sleep apnea History of obstructive sleep apnea on CPAP  HTN (hypertension) History of essential hypertension with blood pressure measured today at 130/74.  He is on amlodipine, and Avalide.     Dorn DOROTHA Lesches MD FACP,FACC,FAHA, [facility] 10/10/2023 2:55 PM

## 2023-10-10 NOTE — Patient Instructions (Signed)
 Medication Instructions:  Your physician recommends that you continue on your current medications as directed. Please refer to the Current Medication list given to you today.  *If you need a refill on your cardiac medications before your next appointment, please call your pharmacy*  Testing/Procedures: Your physician has requested that you have an abdominal aorta duplex. During this test, an ultrasound is used to evaluate the aorta. Allow 30 minutes for this exam. Do not eat after midnight the day before and avoid carbonated beverages. This will take place at [address] 4th floor. **To do in June 2026**  Please note: We ask at that you not bring children with you during ultrasound (echo/ vascular) testing. Due to room size and safety concerns, children are not allowed in the ultrasound rooms during exams. Our front office staff cannot provide observation of children in our lobby area while testing is being conducted. An adult accompanying a patient to their appointment will only be allowed in the ultrasound room at the discretion of the ultrasound technician under special circumstances. We apologize for any inconvenience.   Follow-Up: At [facility], you and your health needs are our priority.  As part of our continuing mission to provide you with exceptional heart care, our providers are all part of one team.  This team includes your primary Cardiologist (physician) and Advanced Practice Providers or APPs (Physician Assistants and Nurse Practitioners) who all work together to provide you with the care you need, when you need it.  Your next appointment:   12 month(s)  Provider:   Dorn Lesches, MD    We recommend signing up for the patient portal called MyChart.  Sign up information is provided on this After Visit Summary.  MyChart is used to connect with patients for Virtual Visits (Telemedicine).  Patients are able to view lab/test results, encounter notes, upcoming  appointments, etc.  Non-urgent messages can be sent to your provider as well.   To learn more about what you can do with MyChart, go to [email or web address].

## 2023-10-12 ENCOUNTER — Telehealth: Payer: Self-pay | Admitting: Gastroenterology

## 2023-10-12 NOTE — Telephone Encounter (Signed)
 Patient advised of negative/normal H pylori testing. He verbalizes understanding.

## 2023-10-12 NOTE — Telephone Encounter (Signed)
 Got it, thanks. Can you let him know H. pylori stool test is negative, indicating the antibiotics given to him previously for this have eradicated the H. pylori.  Good news.

## 2023-10-12 NOTE — Telephone Encounter (Signed)
 Patient calling to f/u on stool test results. Please advise.   Thank you

## 2023-11-16 DIAGNOSIS — D3131 Benign neoplasm of right choroid: Secondary | ICD-10-CM | POA: Diagnosis not present

## 2023-11-16 DIAGNOSIS — H524 Presbyopia: Secondary | ICD-10-CM | POA: Diagnosis not present

## 2023-11-16 DIAGNOSIS — E119 Type 2 diabetes mellitus without complications: Secondary | ICD-10-CM | POA: Diagnosis not present

## 2023-11-16 DIAGNOSIS — Z961 Presence of intraocular lens: Secondary | ICD-10-CM | POA: Diagnosis not present

## 2024-02-02 DIAGNOSIS — G4733 Obstructive sleep apnea (adult) (pediatric): Secondary | ICD-10-CM | POA: Diagnosis not present

## 2024-02-22 DIAGNOSIS — L820 Inflamed seborrheic keratosis: Secondary | ICD-10-CM | POA: Diagnosis not present

## 2024-02-22 DIAGNOSIS — D2272 Melanocytic nevi of left lower limb, including hip: Secondary | ICD-10-CM | POA: Diagnosis not present

## 2024-02-22 DIAGNOSIS — D0439 Carcinoma in situ of skin of other parts of face: Secondary | ICD-10-CM | POA: Diagnosis not present

## 2024-02-22 DIAGNOSIS — L905 Scar conditions and fibrosis of skin: Secondary | ICD-10-CM | POA: Diagnosis not present

## 2024-02-22 DIAGNOSIS — C4441 Basal cell carcinoma of skin of scalp and neck: Secondary | ICD-10-CM | POA: Diagnosis not present

## 2024-02-22 DIAGNOSIS — D485 Neoplasm of uncertain behavior of skin: Secondary | ICD-10-CM | POA: Diagnosis not present

## 2024-02-22 DIAGNOSIS — D2271 Melanocytic nevi of right lower limb, including hip: Secondary | ICD-10-CM | POA: Diagnosis not present

## 2024-02-22 DIAGNOSIS — D225 Melanocytic nevi of trunk: Secondary | ICD-10-CM | POA: Diagnosis not present

## 2024-02-22 DIAGNOSIS — D2261 Melanocytic nevi of right upper limb, including shoulder: Secondary | ICD-10-CM | POA: Diagnosis not present

## 2024-02-22 DIAGNOSIS — L821 Other seborrheic keratosis: Secondary | ICD-10-CM | POA: Diagnosis not present

## 2024-02-22 DIAGNOSIS — D2262 Melanocytic nevi of left upper limb, including shoulder: Secondary | ICD-10-CM | POA: Diagnosis not present

## 2024-02-22 DIAGNOSIS — Z85828 Personal history of other malignant neoplasm of skin: Secondary | ICD-10-CM | POA: Diagnosis not present

## 2024-03-22 ENCOUNTER — Ambulatory Visit: Admitting: Emergency Medicine

## 2024-03-22 ENCOUNTER — Encounter: Payer: Self-pay | Admitting: Emergency Medicine

## 2024-03-22 VITALS — BP 124/62 | HR 60 | Temp 97.8°F | Wt 207.0 lb

## 2024-03-22 DIAGNOSIS — G4733 Obstructive sleep apnea (adult) (pediatric): Secondary | ICD-10-CM

## 2024-03-22 NOTE — Progress Notes (Signed)
 Subjective:    Patient ID: Ricardo [name], male    DOB: January 25, 1941, 83 y.o.   MRN: [id number]   ROV 03/22/2024 --follow-up visit for 83 year old gentleman with a history of obstructive sleep apnea.  He is a former smoker.  He uses CPAP at 9 cmH2O.  Today he reports that he gets significant clinical benefit from CPAP. Much better sleep quality - sleeps through most nights. Significantly boosts his daytime energy. He does not nap frequently. No unintentional sleep. He has contemplated Inspire device.   CPAP compliance report between 02/20/2024 and 03/20/2024 reviewed by me shows that he has used the device for greater than 4 hours 100% of the nights with an average usage time of 8 hours and 23 minute.  His pressure is at 9 cmH2O.  He has minimal leaks.  His AHI is 1.5/h on therapy.   Review of Systems  Respiratory:  Negative for cough.     Past Medical History:  Diagnosis Date   Allergic rhinitis 05/24/2007   Qualifier: Diagnosis of  By: Vicci RN, [name]     Arthritis    Chest pain    Cough 07/13/2016   Depression    Diabetes mellitus without complication (HCC)    Dyspnea    Dysrhythmia    Falls frequently    Hemorrhoids    History of pneumonia    Hx of colonic polyps 10/31/2002   Hyperplastic    Hypertension    Kidney stones    Leg cramps    Melanoma (HCC)    on his forehead   Numbness and tingling in both hands    Obstructive sleep apnea    CPAP   Peptic ulcer    Syncope    Tachycardia      Family History  Problem Relation Age of Onset   Hypertension Mother    Pancreatic cancer Mother    Stroke Father    Anuerysm Father        AAA   Bladder Cancer Sister    Anuerysm Brother        AAA   Colon cancer Brother        dx at age 72   Esophageal cancer Neg Hx      Social History   Socioeconomic History   Marital status: Married    Spouse name: Not on file   Number of children: 3   Years of education: Not on file   Highest education level: Not on file   Occupational History   Occupation: engineer-retired    Employer: [employer]  Tobacco Use   Smoking status: Former    Types: Pipe    Quit date: 04/05/1988    Years since quitting: 35.9   Smokeless tobacco: Never  Vaping Use   Vaping status: Never Used  Substance and Sexual Activity   Alcohol  use: Yes    Comment: occasionally   Drug use: No   Sexual activity: Not on file  Other Topics Concern   Not on file  Social History Narrative   Right handed   Caffeine-1 cup daily   Lives with wife   Social Drivers of Health   Tobacco Use: Medium Risk (03/22/2024)   Patient History    Smoking Tobacco Use: Former    Smokeless Tobacco Use: Never    Passive Exposure: Not on Actuary Strain: Not on file  Food Insecurity: Not on file  Transportation Needs: Not on file  Physical Activity: Not on file  Stress:  Not on file  Social Connections: Unknown (08/18/2021)   Received from [facility]   Social Network    Social Network: Not on file  Intimate Partner Violence: Unknown (07/10/2021)   Received from [facility]   HITS    Physically Hurt: Not on file    Insult or Talk Down To: Not on file    Threaten Physical Harm: Not on file    Scream or Curse: Not on file  Depression (PHQ2-9): Not on file  Alcohol  Screen: Not on file  Housing: Not on file  Utilities: Not on file  Health Literacy: Not on file     No Known Allergies   Outpatient Medications Prior to Visit  Medication Sig Dispense Refill   acetaminophen  (TYLENOL ) 650 MG CR tablet Take by mouth every 8 (eight) hours. 1 1/2 every 8 hours     amLODipine (NORVASC) 5 MG tablet Take 5 mg by mouth daily.     Ascorbic Acid (VITAMIN C) 500 MG CAPS Take 500 mg by mouth daily.     aspirin 81 MG tablet Take 81 mg by mouth daily. Reported on 07/07/2015     cholecalciferol (VITAMIN D3) 25 MCG (1000 UNIT) tablet Take 2,000 Units by mouth daily.     escitalopram  (LEXAPRO ) 10 MG tablet Take 10 mg by mouth daily.      irbesartan  (AVAPRO ) 300 MG tablet Take 300 mg by mouth daily.     irbesartan -hydrochlorothiazide  (AVALIDE) 300-12.5 MG tablet Take 0.5 tablets by mouth 2 (two) times daily.     magnesium gluconate (MAGONATE) 500 (27 Mg) MG TABS tablet Take 500 mg by mouth daily.     metFORMIN  (GLUCOPHAGE -XR) 500 MG 24 hr tablet Take 500 mg by mouth daily.     Multiple Vitamin (MULTIVITAMIN) tablet Take 1 tablet by mouth daily.     Naproxen Sodium 220 MG CAPS Take 220 mg by mouth in the morning and at bedtime.     omeprazole  (PRILOSEC) 20 MG capsule Take 1 capsule (20 mg total) by mouth 2 (two) times daily for 14 days. 28 capsule 0   OVER THE COUNTER MEDICATION Potassium 650mg - contains 99mg  of Potassium. One daily     Potassium 99 MG TABS Take 1 tablet by mouth daily at 6 (six) AM.     No facility-administered medications prior to visit.         Objective:   Physical Exam Vitals:   03/22/24 1604  BP: 124/62  Pulse: 60  Temp: 97.8 F (36.6 C)  SpO2: 100%  Weight: 207 lb (93.9 kg)   Gen: Pleasant, well-nourished, in no distress,  normal affect  ENT: No lesions,  mouth clear,  oropharynx clear, no postnasal drip  Neck: No JVD, no stridor  Lungs: No use of accessory muscles,  clear without rales or rhonchi  Cardiovascular: RRR, heart sounds normal, no murmur or gallops, no peripheral edema  Musculoskeletal: No deformities, no cyanosis or clubbing  Neuro: alert, non focal  Skin: Warm, no lesions or rashes       Assessment & Plan:  Obstructive sleep apnea He has done well on his CPAP and he is compliant with good clinical benefit.  That said he is interested in learning about the Inspire device, wonders whether it may be a better treatment plan for him.  His machine has reached the end of his life and he needs a new CPAP if we are going to continue it.  Will go ahead and order it but depending on the  cost and depending on whether he is a candidate for Inspire he may decide to defer getting  it.  I will make a referral to one of my colleagues in sleep medicine to discuss the pros and cons of making a change to the Inspire  I personally spent a total of 26 minutes in the care of the patient today including preparing to see the patient, getting/reviewing separately obtained history, performing a medically appropriate exam/evaluation, counseling and educating, placing orders, documenting clinical information in the EHR, independently interpreting results, and communicating results.    Lamar Chris, MD, PhD 03/22/2024, 4:30 PM [facility] [phone] or if no answer [phone]

## 2024-03-22 NOTE — Patient Instructions (Signed)
 We discussed treatment for your sleep apnea today. Will refer you to see one of our sleep medicine physicians to discuss the pros and cons of possible Inspire device to treat sleep apnea. In the meantime we will order a new CPAP machine for you.  Depending on the cost and depending on whether your insurance company will cover this without a repeat sleep study you can decide whether you want to go ahead and get the CPAP now before you determine whether you are a candidate for [facility]

## 2024-03-22 NOTE — Assessment & Plan Note (Signed)
 He has done well on his CPAP and he is compliant with good clinical benefit.  That said he is interested in learning about the Inspire device, wonders whether it may be a better treatment plan for him.  His machine has reached the end of his life and he needs a new CPAP if we are going to continue it.  Will go ahead and order it but depending on the cost and depending on whether he is a candidate for Inspire he may decide to defer getting it.  I will make a referral to one of my colleagues in sleep medicine to discuss the pros and cons of making a change to the [facility]

## 2024-04-23 ENCOUNTER — Encounter: Payer: Self-pay | Admitting: Pulmonary Disease

## 2024-04-23 ENCOUNTER — Ambulatory Visit: Admitting: Pulmonary Disease

## 2024-04-23 VITALS — BP 121/71 | HR 64 | Temp 98.0°F | Ht 70.0 in | Wt 207.0 lb

## 2024-04-23 DIAGNOSIS — G4733 Obstructive sleep apnea (adult) (pediatric): Secondary | ICD-10-CM | POA: Diagnosis not present

## 2024-04-23 DIAGNOSIS — I10 Essential (primary) hypertension: Secondary | ICD-10-CM

## 2024-04-23 DIAGNOSIS — J3089 Other allergic rhinitis: Secondary | ICD-10-CM

## 2024-04-23 DIAGNOSIS — Z87891 Personal history of nicotine dependence: Secondary | ICD-10-CM | POA: Diagnosis not present

## 2024-04-23 NOTE — Patient Instructions (Addendum)
 You can go ahead and obtain your new machine  It will be set up based on your previous settings  Dr. Shelah will need to see you within 30 to 90 days after you start using your new machine  You can continue following up with Dr. Shelah  My opinion is that you are better off continuing your CPAP than an inspire device  Call us  with any significant concerns  Will tentatively have an appointment set up for about 3 months to follow-up with Dr. Byrum

## 2024-04-23 NOTE — Progress Notes (Signed)
 "              Ricardo [name]    [id number]    02/05/1941  Primary Care Physician:Paterson, Toribio MATSU, MD  Referring Physician: Yolande Toribio MATSU, MD [address] [place],  [place] [address]  Chief complaint:   Patient with a history of obstructive sleep apnea Has been using CPAP  HPI:  Has used CPAP for over 10 years Diagnosed with moderate obstructive sleep apnea with an AHI of 19.7 Most recent compliance reports have shown adequate treatment  He came in to discuss possibility of an inspire device  He sleeps well, wakes up feeling rested Has some agitation about having to keep the machine in good order, sometimes does issues with the mask and headgear Otherwise, he sleeps well, functions well Stays active  Has seen Dr. Shelah for his sleep apnea  I did discussed an inspire device with him The qualification being moderate obstructive sleep apnea and intolerance to CPAP - I do not believe he is intolerant of CPAP, he does not like it but not intolerant and CPAP seems to be working well Evaluation for placement of the device-a DISE procedure, implantation of a device and follow-up were all discussed with him  He has a history of hypertension, rhinitis, reflux, spondylolisthesis - Symptoms appear controlled  For his rhinitis I did talk to him about using nasal steroids to help symptoms  Outpatient Encounter Medications as of 04/23/2024  Medication Sig   acetaminophen  (TYLENOL ) 650 MG CR tablet Take by mouth every 8 (eight) hours. 1 1/2 every 8 hours   amLODipine (NORVASC) 5 MG tablet Take 5 mg by mouth daily.   Ascorbic Acid (VITAMIN C) 500 MG CAPS Take 500 mg by mouth daily.   aspirin 81 MG tablet Take 81 mg by mouth daily. Reported on 07/07/2015   cholecalciferol (VITAMIN D3) 25 MCG (1000 UNIT) tablet Take 2,000 Units by mouth daily.   escitalopram  (LEXAPRO ) 10 MG tablet Take 10 mg by mouth daily.   irbesartan  (AVAPRO ) 300 MG tablet Take 300 mg by mouth daily.    irbesartan -hydrochlorothiazide  (AVALIDE) 300-12.5 MG tablet Take 0.5 tablets by mouth 2 (two) times daily.   magnesium gluconate (MAGONATE) 500 (27 Mg) MG TABS tablet Take 500 mg by mouth daily.   metFORMIN  (GLUCOPHAGE -XR) 500 MG 24 hr tablet Take 500 mg by mouth daily.   Multiple Vitamin (MULTIVITAMIN) tablet Take 1 tablet by mouth daily.   Naproxen Sodium 220 MG CAPS Take 220 mg by mouth in the morning and at bedtime.   omeprazole  (PRILOSEC) 20 MG capsule Take 1 capsule (20 mg total) by mouth 2 (two) times daily for 14 days.   OVER THE COUNTER MEDICATION Potassium 650mg - contains 99mg  of Potassium. One daily   Potassium 99 MG TABS Take 1 tablet by mouth daily at 6 (six) AM.   No facility-administered encounter medications on file as of 04/23/2024.    Allergies as of 04/23/2024   (No Known Allergies)    Past Medical History:  Diagnosis Date   Allergic rhinitis 05/24/2007   Qualifier: Diagnosis of  By: Vicci RN, [name]     Arthritis    Chest pain    Cough 07/13/2016   Depression    Diabetes mellitus without complication (HCC)    Dyspnea    Dysrhythmia    Falls frequently    Hemorrhoids    History of pneumonia    Hx of colonic polyps 10/31/2002   Hyperplastic    Hypertension  Kidney stones    Leg cramps    Melanoma (HCC)    on his forehead   Numbness and tingling in both hands    Obstructive sleep apnea    CPAP   Peptic ulcer    Syncope    Tachycardia     Past Surgical History:  Procedure Laterality Date   APPENDECTOMY  04/06/1955   CERVICAL FUSION     C4-5, C5-6   COLONOSCOPY     ESOPHAGOGASTRODUODENOSCOPY     MOHS SURGERY     on his forehead   ROTATOR CUFF REPAIR Left     Family History  Problem Relation Age of Onset   Hypertension Mother    Pancreatic cancer Mother    Stroke Father    Anuerysm Father        AAA   Bladder Cancer Sister    Anuerysm Brother        AAA   Colon cancer Brother        dx at age 27   Esophageal cancer Neg Hx      Social History   Socioeconomic History   Marital status: Married    Spouse name: Not on file   Number of children: 3   Years of education: Not on file   Highest education level: Not on file  Occupational History   Occupation: engineer-retired    Employer: [employer]  Tobacco Use   Smoking status: Former    Types: Pipe    Quit date: 04/05/1988    Years since quitting: 36.0   Smokeless tobacco: Never  Vaping Use   Vaping status: Never Used  Substance and Sexual Activity   Alcohol  use: Yes    Comment: occasionally   Drug use: No   Sexual activity: Not on file  Other Topics Concern   Not on file  Social History Narrative   Right handed   Caffeine-1 cup daily   Lives with wife   Social Drivers of Health   Tobacco Use: Medium Risk (04/23/2024)   Patient History    Smoking Tobacco Use: Former    Smokeless Tobacco Use: Never    Passive Exposure: Not on Actuary Strain: Not on file  Food Insecurity: Not on file  Transportation Needs: Not on file  Physical Activity: Not on file  Stress: Not on file  Social Connections: Unknown (08/18/2021)   Received from [facility]   Social Network    Social Network: Not on file  Intimate Partner Violence: Unknown (07/10/2021)   Received from [facility]   HITS    Physically Hurt: Not on file    Insult or Talk Down To: Not on file    Threaten Physical Harm: Not on file    Scream or Curse: Not on file  Depression (EYV7-0): Not on file  Alcohol  Screen: Not on file  Housing: Not on file  Utilities: Not on file  Health Literacy: Not on file    Review of Systems  Respiratory:  Positive for apnea.   Psychiatric/Behavioral:  Positive for sleep disturbance.     Vitals:   04/23/24 1252  BP: 121/71  Pulse: 64  Temp: 98 F (36.7 C)  SpO2: 95%     Physical Exam Constitutional:      Appearance: Normal appearance.  HENT:     Head: Normocephalic.     Nose: Nose normal.     Mouth/Throat:     Mouth:  Mucous membranes are moist.  Eyes:  Pupils: Pupils are equal, round, and reactive to light.  Cardiovascular:     Rate and Rhythm: Normal rate and regular rhythm.     Heart sounds: No murmur heard.    No friction rub.  Pulmonary:     Effort: No respiratory distress.     Breath sounds: No stridor. No wheezing or rhonchi.  Musculoskeletal:     Cervical back: No rigidity or tenderness.  Skin:    General: Skin is warm.  Neurological:     General: No focal deficit present.     Mental Status: He is alert.  Psychiatric:        Mood and Affect: Mood normal.    Data Reviewed: Sleep study from 2016 reviewed  Compliance data not available today but looking through Dr. Lanny notes, AHI been controlled  Assessment/Plan: Moderate obstructive sleep apnea adequately treated with CPAP therapy - Tolerating CPAP well - Improvement in symptoms - Has some aggravation regarding using CPAP  Discussed inspire device as an option of treatment but only in people who are intolerant of CPAP - I do not believe he is intolerant - It does work well, controls symptoms well  Hypertension - Continue current management  For rhinitis - Consider nasal steroids  Follow-up with Dr. Shelah in about 3 months  He will go ahead and contact his medical supply company to have his updated device delivered - He will continue on the same pressures - Encouraged that he has to see Dr. Ruther within 30 to 90 days of starting a new machine to make sure it continues to work well  I spent 30 minutes dedicated to the care of this patient on the date of this encounter to include previsit review of records, face-to-face time with the patient discussing conditions above, post visit ordering of testing,ordering medications,independentlyinterpreting results, clinical documentation with electronic health record    Jennet Epley MD [facility] 04/23/2024, 1:22 PM  CC: Yolande Toribio MATSU, MD   "

## 2024-07-25 ENCOUNTER — Ambulatory Visit: Admitting: Emergency Medicine
